# Patient Record
Sex: Male | Born: 1940 | Race: White | Hispanic: No | Marital: Married | Smoking: Never smoker
Health system: Southern US, Community
[De-identification: ages and names within clinical notes are randomized; demographics above are authoritative.]

## PROBLEM LIST (undated history)

## (undated) DIAGNOSIS — K562 Volvulus: Secondary | ICD-10-CM

## (undated) DIAGNOSIS — R130 Aphagia: Secondary | ICD-10-CM

## (undated) DIAGNOSIS — I10 Essential (primary) hypertension: Secondary | ICD-10-CM

## (undated) DIAGNOSIS — I639 Cerebral infarction, unspecified: Secondary | ICD-10-CM

---

## 2000-08-11 DIAGNOSIS — I639 Cerebral infarction, unspecified: Secondary | ICD-10-CM

## 2000-08-11 HISTORY — DX: Cerebral infarction, unspecified: I63.9

## 2010-02-20 ENCOUNTER — Ambulatory Visit: Payer: Self-pay | Admitting: Nurse Practitioner

## 2010-12-04 ENCOUNTER — Ambulatory Visit: Payer: Self-pay

## 2013-12-27 ENCOUNTER — Observation Stay: Payer: Self-pay | Admitting: Internal Medicine

## 2013-12-27 LAB — COMPREHENSIVE METABOLIC PANEL
ALBUMIN: 3.7 g/dL (ref 3.4–5.0)
ALK PHOS: 106 U/L
ALT: 15 U/L (ref 12–78)
Anion Gap: 3 — ABNORMAL LOW (ref 7–16)
BILIRUBIN TOTAL: 0.3 mg/dL (ref 0.2–1.0)
BUN: 8 mg/dL (ref 7–18)
Calcium, Total: 8.7 mg/dL (ref 8.5–10.1)
Chloride: 104 mmol/L (ref 98–107)
Co2: 34 mmol/L — ABNORMAL HIGH (ref 21–32)
Creatinine: 0.82 mg/dL (ref 0.60–1.30)
EGFR (African American): >60
EGFR (Non-African Amer.): >60
GLUCOSE: 116 mg/dL — AB (ref 65–99)
Osmolality: 281 (ref 275–301)
Potassium: 3.9 mmol/L (ref 3.5–5.1)
SGOT(AST): 15 U/L (ref 15–37)
Sodium: 141 mmol/L (ref 136–145)
Total Protein: 7.4 g/dL (ref 6.4–8.2)

## 2013-12-27 LAB — CBC
HCT: 43.4 % (ref 40.0–52.0)
HGB: 14 g/dL (ref 13.0–18.0)
MCH: 32.6 pg (ref 26.0–34.0)
MCHC: 32.2 g/dL (ref 32.0–36.0)
MCV: 101 fL — ABNORMAL HIGH (ref 80–100)
PLATELETS: 131 10*3/uL — AB (ref 150–440)
RBC: 4.29 10*6/uL — ABNORMAL LOW (ref 4.40–5.90)
RDW: 14 % (ref 11.5–14.5)
WBC: 7.7 10*3/uL (ref 3.8–10.6)

## 2013-12-28 LAB — BASIC METABOLIC PANEL
Anion Gap: 5 — ABNORMAL LOW (ref 7–16)
BUN: 7 mg/dL (ref 7–18)
Calcium, Total: 8.1 mg/dL — ABNORMAL LOW (ref 8.5–10.1)
Chloride: 107 mmol/L (ref 98–107)
Co2: 29 mmol/L (ref 21–32)
Creatinine: 0.76 mg/dL (ref 0.60–1.30)
EGFR (Non-African Amer.): >60
Glucose: 75 mg/dL (ref 65–99)
Osmolality: 278 (ref 275–301)
POTASSIUM: 3.7 mmol/L (ref 3.5–5.1)
SODIUM: 141 mmol/L (ref 136–145)

## 2013-12-28 LAB — CBC WITH DIFFERENTIAL/PLATELET
Basophil #: 0 10*3/uL (ref 0.0–0.1)
Basophil %: 0.6 %
EOS PCT: 2.7 %
Eosinophil #: 0.2 10*3/uL (ref 0.0–0.7)
HCT: 37.7 % — ABNORMAL LOW (ref 40.0–52.0)
HGB: 12.7 g/dL — AB (ref 13.0–18.0)
LYMPHS ABS: 1.7 10*3/uL (ref 1.0–3.6)
LYMPHS PCT: 25.1 %
MCH: 33.1 pg (ref 26.0–34.0)
MCHC: 33.8 g/dL (ref 32.0–36.0)
MCV: 98 fL (ref 80–100)
MONOS PCT: 7.9 %
Monocyte #: 0.5 x10 3/mm (ref 0.2–1.0)
NEUTROS PCT: 63.7 %
Neutrophil #: 4.4 10*3/uL (ref 1.4–6.5)
Platelet: 104 10*3/uL — ABNORMAL LOW (ref 150–440)
RBC: 3.85 10*6/uL — AB (ref 4.40–5.90)
RDW: 13.3 % (ref 11.5–14.5)
WBC: 6.9 10*3/uL (ref 3.8–10.6)

## 2014-12-02 NOTE — H&P (Signed)
PATIENT NAME:  Bill [name], Bill [name] MR#:  [id number] DATE OF BIRTH:  Jan 11, 1941  DATE OF ADMISSION:  12/27/2013  PRIMARY CARE PHYSICIAN:  Dr. Shawn StallHollingsworth  ER PHYSICIAN:  Darien Ramusavid W. Kaminski, MD  CHIEF COMPLAINT: Lower gastrointestinal bleed.   HISTORY OF PRESENT ILLNESS: The patient is a 74 year old male with history of dementia, wheelchair bound, follows up with Dr. Shawn StallHollingsworth, goes to senior care every day. Noted to have rectal bleeding while he was at senior care center today. The patient had 2 episodes of rectal bleed mixed in with stool. The patient's rectal exams were grossly positive for blood. We are admitting him for lower gastrointestinal bleed. The patient is a demented patient, unable to give a history.  History obtained from the wife and Emergency Room physician. The patient has a history of stroke and wheelchair bound for 12 years and goes to senior care center and follows up with Dr. Shawn StallHollingsworth.   PAST MEDICAL HISTORY: Significant for history of dysphasia, dysarthria, CVA before. The patient also has a history of hyperlipidemia, vitamin B12 deficiency, urinary incontinence and constipation.   MEDICATIONS:  1.  Cyanocobalamin 1000 mcg injection every week for 8 weeks, then monthly.  2.  The patient is also on aspirin 81 mg daily.  3.  Ventolin inhaler 2 puffs every 4 to 6 hours as needed for wheezing.  4.  He is on hydrocodone with acetaminophen 5/325 one half tablet p.o. b.i.d. for cough.  5.  The patient also is on vitamin D 2000 units once a day.  6.  The patient also on Claritin 10 mg p.o. daily.   DIET:  Pureed diet as per the wife. .    ALLERGIES: No known allergies.   SOCIAL HISTORY: Lives with the wife and wheelchair bound. No history of smoking. No drinking.   PAST SURGICAL HISTORY: None.   REVIEW OF SYSTEMS: Not obtainable because of the dementia.   PHYSICAL EXAMINATION: VITAL SIGNS: Temperature is 97.8, heart rate 77, blood pressure 136/76, sats 100% on room  air.  HEAD: Atraumatic, normocephalic.  EYES: Pupils equal, reacting. Extraocular muscles intact.   ENT: No tympanic membrane congestion. No turbinate hypertrophy. No oropharyngeal erythema.  NECK: Supple. No JVD or carotid bruit.  RESPIRATORY: Clear to auscultation. No wheeze. No rales.  CARDIOVASCULAR: S1, S2 regular. No murmurs. No gallops. PMI is not displaced. Peripheral pulses are equal in bilateral femoral and dorsalis pedis.  GASTROINTESTINAL: Abdomen is nontender, nondistended. Bowel sounds present. The patient has no organomegaly.  RECTAL EXAMINATION: Revealed gross blood.  SKIN: Warm and dry.  EXTREMITIES: No extremity edema. No cyanosis, no clubbing.  NEUROLOGIC: The patient has aphasia, unable to communicate. No facial droop is observed.   PSYCHIATRIC:  Appears to be oriented, able to recognize family members.   LABORATORY, DIAGNOSTIC AND RADIOLOGICAL DATA:   1.  WBC 7.7, hemoglobin 14, hematocrit 43.4, platelets 131.  2.  Sodium 141, potassium 3.9, chloride 104, bicarbonate 34, BUN 8, creatinine 0.8, glucose 116.  3.  LFTs within normal limits.  4.  EKG is not done in the Emergency Room.   ASSESSMENT AND PLAN: The patient is a 74 year old male with rectal bleed, likely secondary to hemorrhoids. The patient has no previous records here and hemoglobin is stable,  hemodynamically stable so will watch him overnight, check CBC q.12 hours and avoid aspirin. If hemoglobin does not drop, the patient probably can be discharged home. If the patient persistently has bleeding, the patient may need a bleeding scan and GI consult  that at that time. At this time, the patient will be n.p.o. until he does not have any more bleeding and continue IV fluids.   CODE STATUS: Full code.  He has filled out the  MOST form.  It says attempt  CPR.    TIME SPENT: About 55 minutes.    ____________________________ Katha Hamming, MD sk:cs D: 12/27/2013 19:35:16 ET T: 12/27/2013 19:55:49  ET JOB#: [id number]  cc: Katha Hamming, MD, <Dictator> Katha Hamming MD ELECTRONICALLY SIGNED 01/05/2014 12:40

## 2014-12-02 NOTE — Discharge Summary (Signed)
PATIENT NAME:  Bill [name], Bill [name] MR#:  [id number] DATE OF BIRTH:  1941-04-02  DATE OF ADMISSION:  12/27/2013 DATE OF DISCHARGE:  12/28/2013  PRESENTING COMPLAINT: Rectal bleed.   DISCHARGE DIAGNOSES: 1.  Mild rectal bleed, resolved, suspect hemorrhoidal.  2.  History of cerebrovascular accident.  CODE STATUS: Full code.   MEDICATIONS: 1.  Cyanocobalamin 1000 mcg injectable every 2 months.  2.  Tylenol 5 mL every 4 hours, 160 mg/mL. 3.  Aspirin 81 mg daily.  4.  Ventolin HFA 2 puffs 4 times a day.  5.  Percocet 5/325, 1 tablet every 6 hours as needed.  6.  Anusol-HC 2.5% topical cream 3 times a day.  7.  Claritin 10 mg daily.  8.  Vitamin D 3000 international units p.o. daily.   FOLLOWUP:   1.  With Dr. Lavetta NielsenJane Hollingsworth as needed.  2.  With outpatient GI if rectal bleeding continues. If large amount of rectal bleed, return to the ER.   DIET: Pureed diet.   CONSULTATIONS: None.   LABORATORY DATA: Hemoglobin is 12.7, hematocrit 37.7, white count 6.9, platelet count 106. Basic metabolic panel within normal limits.   BRIEF SUMMARY OF HOSPITAL COURSE: [name] is a 74 year old Caucasian gentleman with history of dementia, wheelchair-bound, history of CVA, comes in from [facility] after he was noted to have rectal bleed while he was at the Center.  1.  He had 2 episodes of rectal bleed, which was mild with stool. His rectal exam was positive for blood in the ER. He was admitted overnight, given some IV fluids. No further rectal bleeding occurred. The patient does have some history of constipation on and off. He was tolerating diet. He hemodynamically remained stable. His rectal bleed could be suspicious to be due to mild diverticular versus hemorrhoidal. Wife was explained to use Anusol cream as needed. She was also advised to follow up with outpatient GI if he continues to have intermittent rectal bleed.  2.  History of CVA.  3.  History of advanced dementia.   Hospital stay  otherwise remained stable. The patient remained a full code.   TIME SPENT: 40 minutes.   ____________________________ Wylie HailSona A. Allena KatzPatel, MD sap:jcm D: 12/30/2013 16:37:00 ET T: 12/30/2013 23:12:11 ET JOB#: [id number]  cc: Emmalyn Hinson A. Allena KatzPatel, MD, <Dictator> Kristopher OppenheimJane D. Shawn StallHollingsworth, MD [name] MD ELECTRONICALLY SIGNED 01/01/2014 16:24

## 2015-10-08 ENCOUNTER — Ambulatory Visit: Payer: Self-pay

## 2015-10-08 ENCOUNTER — Ambulatory Visit
Admission: RE | Admit: 2015-10-08 | Discharge: 2015-10-08 | Disposition: A | Discharge status: Home / Self Care | Payer: Medicare (Managed Care) | Source: Ambulatory Visit | Attending: Family | Admitting: Family

## 2015-10-08 ENCOUNTER — Other Ambulatory Visit: Payer: Self-pay | Admitting: Family

## 2015-10-08 DIAGNOSIS — R10811 Right upper quadrant abdominal tenderness: Secondary | ICD-10-CM

## 2015-10-08 DIAGNOSIS — R195 Other fecal abnormalities: Secondary | ICD-10-CM

## 2015-10-08 DIAGNOSIS — K8020 Calculus of gallbladder without cholecystitis without obstruction: Secondary | ICD-10-CM | POA: Diagnosis not present

## 2015-10-09 ENCOUNTER — Ambulatory Visit: Payer: Self-pay

## 2016-04-13 ENCOUNTER — Inpatient Hospital Stay (HOSPITAL_COMMUNITY)
Admission: EM | Admit: 2016-04-13 | Discharge: 2016-04-23 | DRG: 388 | Disposition: A | Discharge status: Home Health | Payer: Medicare (Managed Care) | Attending: Internal Medicine | Admitting: Internal Medicine

## 2016-04-13 ENCOUNTER — Encounter (HOSPITAL_COMMUNITY): Payer: Self-pay

## 2016-04-13 ENCOUNTER — Emergency Department (HOSPITAL_COMMUNITY): Payer: Medicare (Managed Care)

## 2016-04-13 DIAGNOSIS — N390 Urinary tract infection, site not specified: Secondary | ICD-10-CM | POA: Diagnosis present

## 2016-04-13 DIAGNOSIS — R1310 Dysphagia, unspecified: Secondary | ICD-10-CM | POA: Diagnosis present

## 2016-04-13 DIAGNOSIS — G8250 Quadriplegia, unspecified: Secondary | ICD-10-CM | POA: Diagnosis present

## 2016-04-13 DIAGNOSIS — Z7982 Long term (current) use of aspirin: Secondary | ICD-10-CM | POA: Diagnosis not present

## 2016-04-13 DIAGNOSIS — E538 Deficiency of other specified B group vitamins: Secondary | ICD-10-CM | POA: Diagnosis present

## 2016-04-13 DIAGNOSIS — I69320 Aphasia following cerebral infarction: Secondary | ICD-10-CM

## 2016-04-13 DIAGNOSIS — E785 Hyperlipidemia, unspecified: Secondary | ICD-10-CM | POA: Diagnosis present

## 2016-04-13 DIAGNOSIS — Z1624 Resistance to multiple antibiotics: Secondary | ICD-10-CM | POA: Diagnosis present

## 2016-04-13 DIAGNOSIS — Z993 Dependence on wheelchair: Secondary | ICD-10-CM

## 2016-04-13 DIAGNOSIS — K562 Volvulus: Secondary | ICD-10-CM | POA: Diagnosis present

## 2016-04-13 DIAGNOSIS — Z8673 Personal history of transient ischemic attack (TIA), and cerebral infarction without residual deficits: Secondary | ICD-10-CM | POA: Diagnosis not present

## 2016-04-13 DIAGNOSIS — E876 Hypokalemia: Secondary | ICD-10-CM | POA: Diagnosis present

## 2016-04-13 DIAGNOSIS — R109 Unspecified abdominal pain: Secondary | ICD-10-CM | POA: Diagnosis present

## 2016-04-13 DIAGNOSIS — R911 Solitary pulmonary nodule: Secondary | ICD-10-CM | POA: Diagnosis present

## 2016-04-13 HISTORY — DX: Cerebral infarction, unspecified: I63.9

## 2016-04-13 LAB — URINALYSIS, ROUTINE W REFLEX MICROSCOPIC
Glucose, UA: NEGATIVE mg/dL
Hgb urine dipstick: NEGATIVE
Ketones, ur: 15 mg/dL — AB
NITRITE: POSITIVE — AB
Protein, ur: 30 mg/dL — AB
SPECIFIC GRAVITY, URINE: 1.031 — AB (ref 1.005–1.030)
pH: 5 (ref 5.0–8.0)

## 2016-04-13 LAB — COMPREHENSIVE METABOLIC PANEL
ALK PHOS: 77 U/L (ref 38–126)
ALT: 17 U/L (ref 17–63)
ANION GAP: 11 (ref 5–15)
AST: 22 U/L (ref 15–41)
Albumin: 4.2 g/dL (ref 3.5–5.0)
BILIRUBIN TOTAL: 1.2 mg/dL (ref 0.3–1.2)
BUN: 9 mg/dL (ref 6–20)
CALCIUM: 9 mg/dL (ref 8.9–10.3)
CO2: 25 mmol/L (ref 22–32)
Chloride: 105 mmol/L (ref 101–111)
Creatinine, Ser: 0.71 mg/dL (ref 0.61–1.24)
GFR calc non Af Amer: >60 mL/min (ref >60)
Glucose, Bld: 108 mg/dL — ABNORMAL HIGH (ref 65–99)
Potassium: 3 mmol/L — ABNORMAL LOW (ref 3.5–5.1)
Sodium: 141 mmol/L (ref 135–145)
TOTAL PROTEIN: 7.1 g/dL (ref 6.5–8.1)

## 2016-04-13 LAB — CBC
HCT: 47 % (ref 39.0–52.0)
HEMOGLOBIN: 15.4 g/dL (ref 13.0–17.0)
MCH: 31.2 pg (ref 26.0–34.0)
MCHC: 32.8 g/dL (ref 30.0–36.0)
MCV: 95.1 fL (ref 78.0–100.0)
Platelets: 121 10*3/uL — ABNORMAL LOW (ref 150–400)
RBC: 4.94 MIL/uL (ref 4.22–5.81)
RDW: 13.5 % (ref 11.5–15.5)
WBC: 11.8 10*3/uL — AB (ref 4.0–10.5)

## 2016-04-13 LAB — URINE MICROSCOPIC-ADD ON
RBC / HPF: NONE SEEN RBC/hpf (ref 0–5)
WBC UA: NONE SEEN WBC/hpf (ref 0–5)

## 2016-04-13 LAB — I-STAT TROPONIN, ED: TROPONIN I, POC: 0 ng/mL (ref 0.00–0.08)

## 2016-04-13 LAB — LIPASE, BLOOD: Lipase: 18 U/L (ref 11–51)

## 2016-04-13 MED ORDER — MORPHINE SULFATE (PF) 4 MG/ML IV SOLN
4.0000 mg | Freq: Once | INTRAVENOUS | Status: AC
  Administered 2016-04-13: 4 mg via INTRAVENOUS
  Filled 2016-04-13: qty 1

## 2016-04-13 MED ORDER — SODIUM CHLORIDE 0.9 % IV SOLN
INTRAVENOUS | Status: DC
  Administered 2016-04-13: 19:00:00 via INTRAVENOUS

## 2016-04-13 MED ORDER — FENTANYL CITRATE (PF) 100 MCG/2ML IJ SOLN
INTRAMUSCULAR | Status: AC
  Filled 2016-04-13: qty 2

## 2016-04-13 MED ORDER — IOPAMIDOL (ISOVUE-300) INJECTION 61%
INTRAVENOUS | Status: AC
  Administered 2016-04-13: 100 mL
  Filled 2016-04-13: qty 100

## 2016-04-13 MED ORDER — MIDAZOLAM HCL 5 MG/ML IJ SOLN
INTRAMUSCULAR | Status: AC
Start: 2016-04-13 — End: 2016-04-13
  Filled 2016-04-13: qty 2

## 2016-04-13 MED ORDER — POTASSIUM CHLORIDE 10 MEQ/100ML IV SOLN
10.0000 meq | Freq: Once | INTRAVENOUS | Status: AC
  Administered 2016-04-13: 10 meq via INTRAVENOUS
  Filled 2016-04-13: qty 100

## 2016-04-13 NOTE — ED Provider Notes (Addendum)
[facility]-EMERGENCY DEPT Provider Note   CSN: [id number] Arrival date & time: 04/13/16  1619     History   Chief Complaint Chief Complaint  Patient presents with  . Abdominal Pain    HPI Bill [name] is a 75 y.o. male.   Abdominal Pain    Pt has history of stroke.  Non verbal.  Wife provides the history.  Pt started having pain in the abdomen on Friday.  The pain has been sharp, every 5 minutes.  Nothing seems to trigger it but appetite is decreased.  No vomiting.  No diarrhea.   No fevers.  No cough.  No history of prior stomach problems.  Past Medical History:  Diagnosis Date  . Stroke [facility])     There are no active problems to display for this patient.   History reviewed. No pertinent surgical history.     Home Medications    Prior to Admission medications   Not on File    Family History No family history on file.  Social History Social History  Substance Use Topics  . Smoking status: Never Smoker  . Smokeless tobacco: Never Used  . Alcohol use Not on file     Allergies   Review of patient's allergies indicates no known allergies.   Review of Systems Review of Systems  Gastrointestinal: Positive for abdominal pain.  All other systems reviewed and are negative.    Physical Exam Updated Vital Signs BP 144/86   Pulse 99   Temp 98.5 F (36.9 C) (Oral)   Resp 12   SpO2 99%   Physical Exam  Constitutional: He appears well-developed and well-nourished. No distress.  HENT:  Head: Normocephalic and atraumatic.  Right Ear: External ear normal.  Left Ear: External ear normal.  Eyes: Conjunctivae are normal. Right eye exhibits no discharge. Left eye exhibits no discharge. No scleral icterus.  Neck: Neck supple. No tracheal deviation present.  Cardiovascular: Normal rate, regular rhythm and intact distal pulses.   Pulmonary/Chest: Effort normal and breath sounds normal. No stridor. No respiratory distress. He has no wheezes. He has no rales.    Abdominal: Soft. He exhibits no distension and no mass. Bowel sounds are decreased. There is generalized tenderness (mild). There is no rebound and no guarding.  Musculoskeletal: He exhibits no edema or tenderness.  Neurological: He is alert. He has normal strength. No cranial nerve deficit (no facial droop, extraocular movements intact, no slurred speech) or sensory deficit. He exhibits normal muscle tone. He displays no seizure activity. Coordination normal.  Skin: Skin is warm and dry. No rash noted.  Psychiatric: He has a normal mood and affect.  Nursing note and vitals reviewed.    ED Treatments / Results  Labs (all labs ordered are listed, but only abnormal results are displayed) Labs Reviewed  COMPREHENSIVE METABOLIC PANEL - Abnormal; Notable for the following:       Result Value   Potassium 3.0 (*)    Glucose, Bld 108 (*)    All other components within normal limits  CBC - Abnormal; Notable for the following:    WBC 11.8 (*)    Platelets 121 (*)    All other components within normal limits  URINALYSIS, ROUTINE W REFLEX MICROSCOPIC (NOT AT [facility]) - Abnormal; Notable for the following:    Color, Urine ORANGE (*)    Specific Gravity, Urine 1.031 (*)    Bilirubin Urine MODERATE (*)    Ketones, ur 15 (*)    Protein, ur 30 (*)  Nitrite POSITIVE (*)    Leukocytes, UA TRACE (*)    All other components within normal limits  URINE MICROSCOPIC-ADD ON - Abnormal; Notable for the following:    Squamous Epithelial / LPF 0-5 (*)    Bacteria, UA FEW (*)    Casts HYALINE CASTS (*)    All other components within normal limits  LIPASE, BLOOD  I-STAT TROPOININ, ED    EKG  EKG Interpretation  Date/Time:  Sunday April 13 2016 19:39:26 EDT Ventricular Rate:  107 PR Interval:    QRS Duration: 130 QT Interval:  354 QTC Calculation: 473 R Axis:   -26 Text Interpretation:  Sinus tachycardia Biatrial enlargement Right bundle branch block Baseline wander in lead(s) V2 V3 No  previous tracing Confirmed by Clell Trahan  MD-J, Gerad Cornelio ([id number]) on 04/13/2016 7:41:53 PM       Radiology Ct Abdomen Pelvis W Contrast  Result Date: 04/13/2016 CLINICAL DATA:  75 year old male with right side abdominal pain and diarrhea. Initial encounter. EXAM: CT ABDOMEN AND PELVIS WITH CONTRAST TECHNIQUE: Multidetector CT imaging of the abdomen and pelvis was performed using the standard protocol following bolus administration of intravenous contrast. CONTRAST:  ISOVUE-300 IOPAMIDOL (ISOVUE-300) INJECTION 61% COMPARISON:  Abdomen ultrasound 10/08/2015. FINDINGS: Partially visible 6 mm right lung nodule on series 4, image 1. Mild costophrenic angle scarring/ atelectasis. No pericardial or pleural effusion. Degenerative changes in the spine. Osteopenia. No acute osseous abnormality identified. The rectum is distended with fluid. The sigmoid colon is highly redundant and is gas and fluid distended tracking into the right upper quadrant. Following the distal sigmoid colon towards the rectum there is a swirling of the mesentery (see coronal images series 5, images 30 through 46). The left colon is mildly distended with gas, fluid, and some stool but otherwise negative. The transverse colon is redundant and distended with gas and stool. The proximal transverse and right colon contains stool throughout but are somewhat less distended. Negative cecum aside from retained stool. Negative terminal ileum. No dilated small bowel. Decompressed stomach and duodenum. Small volume abdominal free fluid in the right upper quadrant and tracking along the redundant sigmoid colon and in the sigmoid mesentery. no abdominal free air. Liver, gallbladder, spleen, pancreas and adrenal glands are within normal limits. The portal venous system is patent. No portal venous gas. Aortoiliac calcified atherosclerosis noted. Major arterial structures are patent. No lymphadenopathy. IMPRESSION: 1. Positive for Sigmoid Volvulus. This is best  demonstrated on the coronal images (coronal images 30-46). The sigmoid colon is highly redundant and is gas distended tracking up into the right upper quadrant, rather than the left upper quadrant as is more typical for sigmoid volvulus. 2. Small volume of free fluid in the right upper quadrant and in the sigmoid mesenteric. No free air. 3. Highly redundant more proximal colon also distended with gas, fluid, and stool. No dilated small bowel. 4. Partially visible right lung nodule in the middle lobe on image 1. Recommend non-emergent follow-up chest CT (noncontrast should suffice). Electronically Signed   By: Odessa Fleming M.D.   On: 04/13/2016 21:01    Procedures Procedures (including critical care time)  Medications Ordered in ED Medications  0.9 %  sodium chloride infusion ( Intravenous New Bag/Given 04/13/16 1922)  potassium chloride 10 mEq in 100 mL IVPB (not administered)  morphine 4 MG/ML injection 4 mg (4 mg Intravenous Given 04/13/16 1922)  iopamidol (ISOVUE-300) 61 % injection (100 mLs  Contrast Given 04/13/16 2032)     Initial Impression / Assessment  and Plan / ED Course  I have reviewed the triage vital signs and the nursing notes.  Pertinent labs & imaging results that were available during my care of the patient were reviewed by me and considered in my medical decision making (see chart for details).  Clinical Course  Comment By Time  Discussed with Dr Magnus IvanBlackman.  Will consult with GI, medical admission Linwood DibblesJon Jeffre Enriques, MD 09/03 2151   CT scan demonstrates a sigmoid volvulus. Laboratory tests also reviewed showing a mild hypokalemia. I will consult with gastroenterology and speak with the medical service regarding admission. Findings and plan were discussed with the family.  Discussed with GI.  Will see patient in the ED  Final Clinical Impressions(s) / ED Diagnoses   Final diagnoses:  Sigmoid volvulus (HCC)        Linwood DibblesJon Nasya Vincent, MD 04/13/16 2225

## 2016-04-13 NOTE — ED Notes (Signed)
GI MD sts he wanted to do procedure at bedside in ED. Dickey Gavealled Tina, RN on 6E to inform her of pt status. Will call when transporting up

## 2016-04-13 NOTE — ED Triage Notes (Signed)
Patient here with spouse, patient non-verbal due to stroke but unable to understand questions and answers with nods of head. Complains of sharp intermittent abdominal pain since Friday with diarrhea, decreased appetite. No nausea, no distress. Abdomen soft.

## 2016-04-13 NOTE — Consult Note (Signed)
Referring Provider: Dr. Lynelle DoctorKnapp  Primary Care Physician:  Delton PrairieBROOKS, KEATAH B, FNP Primary Gastroenterologist:  Not known   Reason for Consultation:  Sigmoid volvulus  HPI: Bill [name] is a 75 y.o. male presented to the emergency room with abdominal pain of one day duration. Patient with history of stroke. Not able to speak. Patient also with quadriplegia. History obtained from family members and wife. According to wife patient was doing okay until this morning. Had a breakfast without any issue. Subsequently started complaining of lower quadrant abdominal pain. Pain worse with oral intake. Decided to bring him to the ER. CT scan showed sigmoid volvulus. GI is consulted for further management. Again, unable to get any history from patient.  Past Medical History:  Diagnosis Date  . Stroke [facility](HCC) 2002   completely dependent with ADLs/IADLs    History reviewed. No pertinent surgical history.  Prior to Admission medications   Not on File    Scheduled Meds:  Continuous Infusions: . sodium chloride 125 mL/hr at 04/13/16 1922  . potassium chloride 10 mEq (04/13/16 2217)   PRN Meds:.  Allergies as of 04/13/2016  . (No Known Allergies)    History reviewed. No pertinent family history.  Social History   Social History  . Marital status: Married    Spouse name: N/A  . Number of children: N/A  . Years of education: N/A   Occupational History  . retired    Social History Main Topics  . Smoking status: Never Smoker  . Smokeless tobacco: Never Used  . Alcohol use No  . Drug use: No  . Sexual activity: Not on file   Other Topics Concern  . Not on file   Social History Narrative  . No narrative on file    Review of Systems: Not able to obtain  Physical Exam: Vital signs: Vitals:   04/13/16 2015 04/13/16 2115  BP: 137/80 144/86  Pulse: 104 99  Resp: 15 12  Temp:       General:   Alert,  Aphasic. Responds to verbal, and. Lungs:  Clear throughout to auscultation.   No  wheezes, crackles, or rhonchi. No acute distress. Heart:  Regular rate and rhythm; no murmurs, clicks, rubs,  or gallops. Abdomen: Tympanic abdomen. Nontender. Hyperactive bowel sounds. LE: no edema   GI:  Lab Results:  Recent Labs  04/13/16 1725  WBC 11.8*  HGB 15.4  HCT 47.0  PLT 121*   BMET  Recent Labs  04/13/16 1725  NA 141  K 3.0*  CL 105  CO2 25  GLUCOSE 108*  BUN 9  CREATININE 0.71  CALCIUM 9.0   LFT  Recent Labs  04/13/16 1725  PROT 7.1  ALBUMIN 4.2  AST 22  ALT 17  ALKPHOS 77  BILITOT 1.2   PT/INR No results for input(s): LABPROT, INR in the last 72 hours.   Studies/Results: Ct Abdomen Pelvis W Contrast  Result Date: 04/13/2016 CLINICAL DATA:  75 year old male with right side abdominal pain and diarrhea. Initial encounter. EXAM: CT ABDOMEN AND PELVIS WITH CONTRAST TECHNIQUE: Multidetector CT imaging of the abdomen and pelvis was performed using the standard protocol following bolus administration of intravenous contrast. CONTRAST:  100mL ISOVUE-300 IOPAMIDOL (ISOVUE-300) INJECTION 61% COMPARISON:  Abdomen ultrasound 10/08/2015. FINDINGS: Partially visible 6 mm right lung nodule on series 4, image 1. Mild costophrenic angle scarring/ atelectasis. No pericardial or pleural effusion. Degenerative changes in the spine. Osteopenia. No acute osseous abnormality identified. The rectum is distended with fluid. The sigmoid  colon is highly redundant and is gas and fluid distended tracking into the right upper quadrant. Following the distal sigmoid colon towards the rectum there is a swirling of the mesentery (see coronal images series 5, images 30 through 46). The left colon is mildly distended with gas, fluid, and some stool but otherwise negative. The transverse colon is redundant and distended with gas and stool. The proximal transverse and right colon contains stool throughout but are somewhat less distended. Negative cecum aside from retained stool. Negative  terminal ileum. No dilated small bowel. Decompressed stomach and duodenum. Small volume abdominal free fluid in the right upper quadrant and tracking along the redundant sigmoid colon and in the sigmoid mesentery. no abdominal free air. Liver, gallbladder, spleen, pancreas and adrenal glands are within normal limits. The portal venous system is patent. No portal venous gas. Aortoiliac calcified atherosclerosis noted. Major arterial structures are patent. No lymphadenopathy. IMPRESSION: 1. Positive for Sigmoid Volvulus. This is best demonstrated on the coronal images (coronal images 30-46). The sigmoid colon is highly redundant and is gas distended tracking up into the right upper quadrant, rather than the left upper quadrant as is more typical for sigmoid volvulus. 2. Small volume of free fluid in the right upper quadrant and in the sigmoid mesenteric. No free air. 3. Highly redundant more proximal colon also distended with gas, fluid, and stool. No dilated small bowel. 4. Partially visible right lung nodule in the middle lobe on image 1. Recommend non-emergent follow-up chest CT (noncontrast should suffice). Electronically Signed   By: Odessa Fleming M.D.   On: 04/13/2016 21:01    Impression/Plan: Sigmoid volvulus.  History of Stroke with residual aphasia and quadriplegia. Colonoscopy 2 years ago at [facility]. Normal per wife.  Recommendations --------------------------- - Flexible sigmoidoscopy for decompression. Risk, benefits and alternatives discussed with the patient's wife and daughter. High risk for perforation and bleeding discussed as well. Verbalized understanding. - Recommend surgical consultation as well as there is high chance of recurrence after flexible sigmoidoscopy decompression     LOS: 0 days   Anneka Studer  04/13/2016, 10:50 PM  Pager [phone] If no answer or after 5 PM call [phone]

## 2016-04-13 NOTE — H&P (Signed)
History and Physical    KARAM DUNSON ZOX:[id number] DOB: [birth year] DOA: 04/13/2016  PCP: Delton Prairie, FNP - [facility] [place] Consultants:  None Patient coming from: home - lives with wife  Chief Complaint: abdominal pain  HPI: DASCHEL ROUGHTON is a 75 y.o. 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  His wife provided the history since the patient is unable to communicate.  She reports that he has had lower abdominal pain.  Ate breakfast but wouldn't eat during the rest of the day.  Pain started Friday, worse yesterday.  Small BMs, loose and dark.  No n/v.  Drinks fluids slowly.  They live in [name] and are spending the long weekend visiting their grandchildren here in [place].   ED Course: Per Dr. Lynelle Doctor: CT scan demonstrates a sigmoid volvulus. Laboratory tests also reviewed showing a mild hypokalemia. I will consult with gastroenterology and speak with the medical service regarding admission. Findings and plan were discussed with the family.  Discussed with GI.  Will see patient in the ED  Patient was also discussed with Dr. Magnus Ivan.  Review of Systems: Unable to review  Ambulatory Status:  bedbound for 14-15 years  Past Medical History:  Diagnosis Date  . Stroke [facility]) 2002   completely dependent with ADLs/IADLs    History reviewed. No pertinent surgical history.  Social History   Social History  . Marital status: Married    Spouse name: N/A  . Number of children: N/A  . Years of education: N/A   Occupational History  . retired    Social History Main Topics  . Smoking status: Never Smoker  . Smokeless tobacco: Never Used  . Alcohol use No  . Drug use: No  . Sexual activity: Not on file   Other Topics Concern  . Not on file   Social History Narrative  . No narrative on file    No Known Allergies  History reviewed. No pertinent  family history.  Prior to Admission medications   Not on File    Physical Exam: Vitals:   04/13/16 1845 04/13/16 1930 04/13/16 2015 04/13/16 2115  BP: 135/83 164/88 137/80 144/86  Pulse: 105 107 104 99  Resp: 18 16 15 12   Temp:      TempSrc:      SpO2: 99% 99% 96% 99%     General: Appears calm and comfortable and is NAD; smiles and grunts but otherwise is unable to effectively communicate Eyes:  PERRL, EOMI, normal lids, iris ENT:  grossly normal hearing (per wife), lips & tongue, mmm Neck:  no LAD, masses or thyromegaly Cardiovascular:  Mild tachycardia, no m/r/g. No LE edema.  Respiratory: CTA bilaterally, no w/r/r. Normal respiratory effort. Abdomen:  soft, ntnd, NABS Skin:  no rash or induration seen on limited exam Musculoskeletal:  Atrophic LE bilaterally Psychiatric: smiles, is pleasant, does not effectively communicate otherwise Neurologic: unable to perform  Labs on Admission: I have personally reviewed following labs and imaging studies  CBC:  Recent Labs Lab 04/13/16 1725  WBC 11.8*  HGB 15.4  HCT 47.0  MCV 95.1  PLT 121*   Basic Metabolic Panel:  Recent Labs Lab 04/13/16 1725  NA 141  K 3.0*  CL 105  CO2 25  GLUCOSE 108*  BUN 9  CREATININE 0.71  CALCIUM 9.0   GFR: CrCl cannot be calculated (Unknown ideal weight.). Liver Function Tests:  Recent  Labs Lab 04/13/16 1725  AST 22  ALT 17  ALKPHOS 77  BILITOT 1.2  PROT 7.1  ALBUMIN 4.2    Recent Labs Lab 04/13/16 1725  LIPASE 18   No results for input(s): AMMONIA in the last 168 hours. Coagulation Profile: No results for input(s): INR, PROTIME in the last 168 hours. Cardiac Enzymes: No results for input(s): CKTOTAL, CKMB, CKMBINDEX, TROPONINI in the last 168 hours. BNP (last 3 results) No results for input(s): PROBNP in the last 8760 hours. HbA1C: No results for input(s): HGBA1C in the last 72 hours. CBG: No results for input(s): GLUCAP in the last 168 hours. Lipid  Profile: No results for input(s): CHOL, HDL, LDLCALC, TRIG, CHOLHDL, LDLDIRECT in the last 72 hours. Thyroid Function Tests: No results for input(s): TSH, T4TOTAL, FREET4, T3FREE, THYROIDAB in the last 72 hours. Anemia Panel: No results for input(s): VITAMINB12, FOLATE, FERRITIN, TIBC, IRON, RETICCTPCT in the last 72 hours. Urine analysis:    Component Value Date/Time   COLORURINE ORANGE (A) 04/13/2016 1933   APPEARANCEUR CLEAR 04/13/2016 1933   LABSPEC 1.031 (H) 04/13/2016 1933   PHURINE 5.0 04/13/2016 1933   GLUCOSEU NEGATIVE 04/13/2016 1933   HGBUR NEGATIVE 04/13/2016 1933   BILIRUBINUR MODERATE (A) 04/13/2016 1933   KETONESUR 15 (A) 04/13/2016 1933   PROTEINUR 30 (A) 04/13/2016 1933   NITRITE POSITIVE (A) 04/13/2016 1933   LEUKOCYTESUR TRACE (A) 04/13/2016 1933    Creatinine Clearance: CrCl cannot be calculated (Unknown ideal weight.).  Sepsis Labs: @LABRCNTIP (procalcitonin:4,lacticidven:4) )No results found for this or any previous visit (from the past 240 hour(s)).   Radiological Exams on Admission: Ct Abdomen Pelvis W Contrast  Result Date: 04/13/2016 CLINICAL DATA:  75 year old male with right side abdominal pain and diarrhea. Initial encounter. EXAM: CT ABDOMEN AND PELVIS WITH CONTRAST TECHNIQUE: Multidetector CT imaging of the abdomen and pelvis was performed using the standard protocol following bolus administration of intravenous contrast. CONTRAST:  ISOVUE-300 IOPAMIDOL (ISOVUE-300) INJECTION 61% COMPARISON:  Abdomen ultrasound 10/08/2015. FINDINGS: Partially visible 6 mm right lung nodule on series 4, image 1. Mild costophrenic angle scarring/ atelectasis. No pericardial or pleural effusion. Degenerative changes in the spine. Osteopenia. No acute osseous abnormality identified. The rectum is distended with fluid. The sigmoid colon is highly redundant and is gas and fluid distended tracking into the right upper quadrant. Following the distal sigmoid colon towards the  rectum there is a swirling of the mesentery (see coronal images series 5, images 30 through 46). The left colon is mildly distended with gas, fluid, and some stool but otherwise negative. The transverse colon is redundant and distended with gas and stool. The proximal transverse and right colon contains stool throughout but are somewhat less distended. Negative cecum aside from retained stool. Negative terminal ileum. No dilated small bowel. Decompressed stomach and duodenum. Small volume abdominal free fluid in the right upper quadrant and tracking along the redundant sigmoid colon and in the sigmoid mesentery. no abdominal free air. Liver, gallbladder, spleen, pancreas and adrenal glands are within normal limits. The portal venous system is patent. No portal venous gas. Aortoiliac calcified atherosclerosis noted. Major arterial structures are patent. No lymphadenopathy. IMPRESSION: 1. Positive for Sigmoid Volvulus. This is best demonstrated on the coronal images (coronal images 30-46). The sigmoid colon is highly redundant and is gas distended tracking up into the right upper quadrant, rather than the left upper quadrant as is more typical for sigmoid volvulus. 2. Small volume of free fluid in the right upper quadrant and in  the sigmoid mesenteric. No free air. 3. Highly redundant more proximal colon also distended with gas, fluid, and stool. No dilated small bowel. 4. Partially visible right lung nodule in the middle lobe on image 1. Recommend non-emergent follow-up chest CT (noncontrast should suffice). Electronically Signed   By: Odessa FlemingH  Hall M.D.   On: 04/13/2016 21:01    EKG: Independently reviewed.  Sinus tachycardia with rate 107; RBBB; nonspecific ST changes with no evidence of acute ischemia  Assessment/Plan Principal Problem:   Sigmoid volvulus (HCC) Active Problems:   H/O: CVA (cerebrovascular accident)   Pulmonary nodule   Hypokalemia   Sigmoid volvulus -Patient with subacute onset of  abdominal pain found to have volvulus on CT -GI has been consulted and is planning to do flexible sigmoidoscopy to try to reduce (consult pending) -If unsuccessful and/or has recurrence, may need surgical repair; gen surg has also been consulted by the ER -Leukocytosis likely as a result, will recheck in AM  H/o CVA -Severe debility, completely dependent for ADLs -Only takes ASA daily (will continue) -Was prescribed Lipitor at last hospitalization ([facility] or [facility]), does not appear to be taking it -Unclear benefit from HTN/HLD control at this time and may not be worth side effect risk  -Will monitor on telemetry given his tenuous baseline condition and h/o multiple recurrent CVAs  Pulmonary nodule -Consider outpatient f/u  Hypokalemia -Repleted in ER -Will check BMP in AM  Other -Patient with extremely limited quality of life (although wife does apparently take him on trips to visit family) -Would benefit from palliative care discussion at some point -Family seems fairly resistant to care (threatened to leave AMA from ER despite emergent medical condition in need of imminent repair), so will defer to PCP -Patient is full code   DVT prophylaxis: Lovenox Code Status:  Full - confirmed with patient/family Family Communication: Wife and other family members present throughout evaluation Disposition Plan: To be determined Consults called: GI, general surgery (by ER)   Admission status: Admit to telemetry    Jonah BlueJennifer Margarita Bobrowski MD [facility]  If 7PM-7AM, please contact night-coverage [email or web address] Password [email or web address]  04/13/2016, 11:16 PM

## 2016-04-14 ENCOUNTER — Encounter (HOSPITAL_COMMUNITY): Payer: Self-pay | Admitting: *Deleted

## 2016-04-14 ENCOUNTER — Encounter (HOSPITAL_COMMUNITY)
Admission: EM | Disposition: A | Discharge status: Home Health | Payer: Self-pay | Source: Home / Self Care | Attending: Family Medicine

## 2016-04-14 ENCOUNTER — Inpatient Hospital Stay (HOSPITAL_COMMUNITY): Payer: Medicare (Managed Care)

## 2016-04-14 HISTORY — PX: FLEXIBLE SIGMOIDOSCOPY: SHX5431

## 2016-04-14 LAB — CBC
HCT: 39.8 % (ref 39.0–52.0)
Hemoglobin: 12.7 g/dL — ABNORMAL LOW (ref 13.0–17.0)
MCH: 30.5 pg (ref 26.0–34.0)
MCHC: 31.9 g/dL (ref 30.0–36.0)
MCV: 95.7 fL (ref 78.0–100.0)
PLATELETS: 98 10*3/uL — AB (ref 150–400)
RBC: 4.16 MIL/uL — ABNORMAL LOW (ref 4.22–5.81)
RDW: 13.5 % (ref 11.5–15.5)
WBC: 8.4 10*3/uL (ref 4.0–10.5)

## 2016-04-14 LAB — BASIC METABOLIC PANEL
Anion gap: 7 (ref 5–15)
BUN: 8 mg/dL (ref 6–20)
CO2: 26 mmol/L (ref 22–32)
CREATININE: 0.76 mg/dL (ref 0.61–1.24)
Calcium: 7.9 mg/dL — ABNORMAL LOW (ref 8.9–10.3)
Chloride: 108 mmol/L (ref 101–111)
GFR calc Af Amer: >60 mL/min (ref >60)
GLUCOSE: 92 mg/dL (ref 65–99)
Potassium: 3.4 mmol/L — ABNORMAL LOW (ref 3.5–5.1)
SODIUM: 141 mmol/L (ref 135–145)

## 2016-04-14 SURGERY — SIGMOIDOSCOPY, FLEXIBLE
Anesthesia: Moderate Sedation | Laterality: Left

## 2016-04-14 MED ORDER — ENOXAPARIN SODIUM 40 MG/0.4ML ~~LOC~~ SOLN
40.0000 mg | Freq: Every day | SUBCUTANEOUS | Status: DC
  Administered 2016-04-14 – 2016-04-17 (×4): 40 mg via SUBCUTANEOUS
  Filled 2016-04-14 (×4): qty 0.4

## 2016-04-14 MED ORDER — FENTANYL CITRATE (PF) 100 MCG/2ML IJ SOLN
INTRAMUSCULAR | Status: DC | PRN
  Administered 2016-04-14: 25 ug via INTRAVENOUS

## 2016-04-14 MED ORDER — ACETAMINOPHEN 325 MG PO TABS
650.0000 mg | ORAL_TABLET | Freq: Four times a day (QID) | ORAL | Status: DC | PRN
  Administered 2016-04-16: 650 mg via ORAL
  Filled 2016-04-14: qty 2

## 2016-04-14 MED ORDER — ASPIRIN 81 MG PO CHEW
81.0000 mg | CHEWABLE_TABLET | Freq: Every day | ORAL | Status: DC
  Administered 2016-04-15 – 2016-04-23 (×9): 81 mg via ORAL
  Filled 2016-04-14 (×8): qty 1

## 2016-04-14 MED ORDER — ONDANSETRON HCL 4 MG/2ML IJ SOLN: 4.0000 mg | Freq: Four times a day (QID) | INTRAMUSCULAR | Status: DC | PRN

## 2016-04-14 MED ORDER — ONDANSETRON HCL 4 MG PO TABS: 4.0000 mg | ORAL_TABLET | Freq: Four times a day (QID) | ORAL | Status: DC | PRN

## 2016-04-14 MED ORDER — MIDAZOLAM HCL 2 MG/2ML IJ SOLN
INTRAMUSCULAR | Status: DC | PRN
  Administered 2016-04-14: 2 mg via INTRAVENOUS

## 2016-04-14 MED ORDER — SODIUM CHLORIDE 0.9% FLUSH
3.0000 mL | Freq: Two times a day (BID) | INTRAVENOUS | Status: DC
  Administered 2016-04-14 – 2016-04-22 (×4): 3 mL via INTRAVENOUS

## 2016-04-14 MED ORDER — ACETAMINOPHEN 650 MG RE SUPP: 650.0000 mg | Freq: Four times a day (QID) | RECTAL | Status: DC | PRN

## 2016-04-14 MED ORDER — LACTATED RINGERS IV SOLN
INTRAVENOUS | Status: DC
  Administered 2016-04-14 (×2): via INTRAVENOUS

## 2016-04-14 MED ORDER — MORPHINE SULFATE (PF) 2 MG/ML IV SOLN: 2.0000 mg | INTRAVENOUS | Status: DC | PRN

## 2016-04-14 NOTE — Progress Notes (Signed)
Pt arrived from ED and noticed scant amount of blood coming from urethra. Cleansed area and applied condom cath. Will continue to monitor.

## 2016-04-14 NOTE — Progress Notes (Signed)
[facility] Progress Note  Iva LentoWilliam M Mittelstaedt 75 y.o. 03/15/1941   Subjective: Patient is feeling better. Abdominal pain resolved. Afebrile.  Objective: Vital signs in last 24 hours: Vitals:   04/14/16 0509 04/14/16 1024  BP: (!) 146/71 (!) 152/71  Pulse: 70 67  Resp: 19 18  Temp: 98.2 F (36.8 C) 98 F (36.7 C)    Physical Exam:  General:   Alert,  Aphasic. Responds to verbal, and. Lungs:  Clear throughout to auscultation.   No wheezes, crackles, or rhonchi. No acute distress. Heart:  Regular rate and rhythm; no murmurs, clicks, rubs,  or gallops. Abdomen: Mild distention noted. Nontender. Bowel sounds present LE: no edema                   Lab Results:  Recent Labs  04/13/16 1725 04/14/16 0232  NA 141 141  K 3.0* 3.4*  CL 105 108  CO2 25 26  GLUCOSE 108* 92  BUN 9 8  CREATININE 0.71 0.76  CALCIUM 9.0 7.9*    Recent Labs  04/13/16 1725  AST 22  ALT 17  ALKPHOS 77  BILITOT 1.2  PROT 7.1  ALBUMIN 4.2    Recent Labs  04/13/16 1725 04/14/16 0232  WBC 11.8* 8.4  HGB 15.4 12.7*  HCT 47.0 39.8  MCV 95.1 95.7  PLT 121* 98*   No results for input(s): LABPROT, INR in the last 72 hours.    Assessment/Plan: Acute sigmoid volvulus status post endoscopic  decompression  History of Stroke with residual aphasia and quadriplegia. [facility] Normal per wife.  Recommendation ---------------------- - Follow surgical recommendation. Notes are reviewed.  - Okay to have clear liquid diet from GI standpoint, if it is okay with surgery - GI will sign off. Call us back if needed.  Danni Shima 04/14/2016, 12:05 PM  Pager [phone]  If no answer or after 5 PM call [phone]

## 2016-04-14 NOTE — Progress Notes (Signed)
Bill LentoWilliam M [name] ONG:[id number] DOB: 08/16/1940 DOA: 04/13/2016 PCP: Delton PrairieBROOKS, KEATAH B, FNP  Brief narrative:  75 year old male [facility] [place]-at baseline is wheelchair bound and aphasic and is on a pured diet Prior multiple CVAs complicated by dysphagia and admissions for aspiration/2014 Hyperlipidemia B12 deficiency Urinary incontinence History of lower GI bleed-diverticular bleed vs hemorrhoids 12/2013  Past medical history-As per Problem list Chart reviewed as below-   Consultants:  Gastroenterology  General surgery    Procedures:  Sigmoid decompression  Antibiotics:  None currently   Subjective  Fair No n/v/cp No diarr Cannot communicate needs nor ROS Still npo    Objective    Interim History:   Telemetry:    Objective: Vitals:   04/14/16 0025 04/14/16 0143 04/14/16 0509 04/14/16 1024  BP: 120/64 (!) 156/83 (!) 146/71 (!) 152/71  Pulse: 95 85 70 67  Resp: 14 17 19 18   Temp:  99.3 F (37.4 C) 98.2 F (36.8 C) 98 F (36.7 C)  TempSrc:  Axillary Oral Oral  SpO2: 99% 96% 100% 100%  Weight:  58.4 kg (128 lb 12 oz)      Intake/Output Summary (Last 24 hours) at 04/14/16 0755 Last data filed at 04/14/16 0600  Gross per 24 hour  Intake           191.67 ml  Output                0 ml  Net           191.67 ml    Exam:  General: eomi ncat, aphasic Cardiovascular:  s1 s2 no m/r/g Respiratory: clear no added sound Abdomen:  Soft nt nd-slight distension, no rebound, no gaurd Skin no le edema Neuro intact but aphasic-cannot foll commands  Data Reviewed: Basic Metabolic Panel:  Recent Labs Lab 04/13/16 1725 04/14/16 0232  NA 141 141  K 3.0* 3.4*  CL 105 108  CO2 25 26  GLUCOSE 108* 92  BUN 9 8  CREATININE 0.71 0.76  CALCIUM 9.0 7.9*   Liver Function Tests:  Recent Labs Lab 04/13/16 1725  AST 22  ALT 17  ALKPHOS 77  BILITOT 1.2  PROT 7.1  ALBUMIN 4.2    Recent Labs Lab 04/13/16 1725    LIPASE 18   No results for input(s): AMMONIA in the last 168 hours. CBC:  Recent Labs Lab 04/13/16 1725 04/14/16 0232  WBC 11.8* 8.4  HGB 15.4 12.7*  HCT 47.0 39.8  MCV 95.1 95.7  PLT 121* 98*   Cardiac Enzymes: No results for input(s): CKTOTAL, CKMB, CKMBINDEX, TROPONINI in the last 168 hours. BNP: Invalid input(s): POCBNP CBG: No results for input(s): GLUCAP in the last 168 hours.  No results found for this or any previous visit (from the past 240 hour(s)).   Studies:              All Imaging reviewed and is as per above notation   Scheduled Meds: . aspirin  81 mg Oral Daily  . enoxaparin (LOVENOX) injection  40 mg Subcutaneous Daily  . sodium chloride flush  3 mL Intravenous Q12H   Continuous Infusions: . lactated ringers 100 mL/hr at 04/14/16 0405     Assessment/Plan:  Sigmoid volvulus -Patient with subacute onset of abdominal pain found to have volvulus on CT -GI did flexible sigmoidoscopy toreduce  -?may need surgical repair; gen surg input appreciated and await final discussions -Leukocytosis lresolved  H/o CVA -Severe debility, completely dependent for ADLs -Only takes  ASA daily (will continue) -Was prescribed Lipitor at last hospitalization ([facility] or [facility]), does not appear to be taking it -Unclear benefit from HTN/HLD control, wouldn't probably chenge long-term outcomes  Pulmonary nodule -Consider outpatient f/u  Hypokalemia -Repleted in ER -recheck in am  Inpatient pending final discussions re: need for colectomy this admit   Pleas Koch, MD  [facility] Pager [phone] 04/14/2016, 7:55 AM    LOS: 1 day

## 2016-04-14 NOTE — Progress Notes (Signed)
Spoke with [facility] per family request of patient.  On call MD informed this RN that rounding surgeon will see patient tomorrow, 04/15/2016.  Diet can be advanced to clear liquids for the remaining of today and NPO after midnight.

## 2016-04-14 NOTE — Op Note (Signed)
[facility] Patient Name: [name] Procedure Date : 04/14/2016 MRN: [id number] Attending MD: Kathi Der , MD Date of Birth: March 17, 1941 CSN: [id number] Age: 75 Admit Type: Outpatient Procedure:                Flexible Sigmoidoscopy Indications:              Abnormal CT of the GI tract Providers:                Kathi Der, MD, Sarah Monday RN, RN, Lorenda Ishihara, Technician Referring MD:              Medicines:                Fentanyl 25 micrograms IV, Midazolam 2 mg IV Complications:            No immediate complications. Estimated Blood Loss:     Estimated blood loss: none. Procedure:                Pre-Anesthesia Assessment:                           - Prior to the procedure, a History and Physical                            was performed, and patient medications and                            allergies were reviewed. The patient's tolerance of                            previous anesthesia was also reviewed. The risks                            and benefits of the procedure and the sedation                            options and risks were discussed with the patient.                            All questions were answered, and informed consent                            was obtained. Prior Anticoagulants: The patient has                            taken no previous anticoagulant or antiplatelet                            agents. ASA Grade Assessment: III - A patient with                            severe systemic disease. After reviewing the risks  and benefits, the patient was deemed in                            satisfactory condition to undergo the procedure.                           After obtaining informed consent, the scope was                            passed under direct vision. The [id number] ([id number])                            scope was introduced through the anus and advanced                             to the the left transverse colon. The flexible                            sigmoidoscopy was accomplished with ease. The                            patient tolerated the procedure well. The quality                            of the bowel preparation was poor. Scope In: Scope Out: Findings:      stool felt on rectal exam. limited evaluation secondary to pain.      A volvulus, with viable appearing mucosa, was found in the distal       sigmoid colon. Decompression of the volvulus was attempted and was       successful, with complete decompression achieved. Estimated blood loss:       none.      scope was advanced up to the transverse colon. Certain part of colonic       mucosa was not well visualized because of retained stool.      rectum was also not well visualized because of retained stool. Impression:               - Preparation of the colon was poor.                           - Volvulus. Successful complete decompression                            achieved.                           - No specimens collected. Recommendation:           - Admit the patient to hospital ward for ongoing                            care.                           - NPO. Procedure Code(s):        --- Professional ---  [phone], Sigmoidoscopy, flexible; with decompression                            (for pathologic distention) (eg, volvulus,                            megacolon), including placement of decompression                            tube, when performed Diagnosis Code(s):        --- Professional ---                           K56.2, Volvulus                           R93.3, Abnormal findings on diagnostic imaging of                            other parts of digestive tract CPT copyright 2016 [employer]. All rights reserved. The codes documented in this report are preliminary and upon coder review may  be revised to meet current compliance  requirements. Kathi DerParag Lela Murfin, MD Kathi DerParag Meigan [place]ates, MD 04/14/2016 12:38:00 AM Number of Addenda: 0

## 2016-04-14 NOTE — Care Management (Signed)
04/14/2016 [facility], CM following for progression and d/c planning at this time.

## 2016-04-14 NOTE — Progress Notes (Signed)
New Admission Note: 04/14/2016  Arrival Method: stretcher Mental Orientation:alert and oriented to person only Telemetry: 6E17, 2nd verification completed Assessment: completed Skin: 2 RNs checked. See skin assessment.  IV: RAC with fluids infusing Safety Measures: Bed in low position, bed alarm on.   Admission: Completed [address] Orientation: Patient has been orientated to the room, unit and staff.  Family:Wife at bedside.  Orders have been reviewed and implemented. Will continue to monitor the patient.

## 2016-04-14 NOTE — Consult Note (Signed)
Reason for Consult: Sigmoid volvulus Referring Physician: Dr. Karmen [name]  Bill [name] is an 75 y.o. male.  HPI: We have been asked to see this 75 year old gentleman who presented late yesterday with a sigmoid volvulus. This was found on a CT of the abdomen and pelvis. GI has already evaluated the patient and has performed a flexible sigmoidoscopy reducing the volvulus. No abnormalities or ischemia was seen. 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  He and his wife were visiting family and lives currently in [place]. This morning he denies abdominal pain.  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 He had been doing well until couple days ago when the abdominal pain started along with loose bowel movements. Past Medical History:  Diagnosis Date  . Stroke [facility]) 2002   completely dependent with ADLs/IADLs    History reviewed. No pertinent surgical history.  History reviewed. No pertinent family history.  Social History:  reports that he has never smoked. He has never used smokeless tobacco. He reports that he does not drink alcohol or use drugs.  Allergies: No Known Allergies  Medications: I have reviewed the patient's current medications.  Results for orders placed or performed during the hospital encounter of 04/13/16 (from the past 48 hour(s))  Lipase, blood     Status: None   Collection Time: 04/13/16  5:25 PM  Result Value Ref Range   Lipase 18 11 - 51 U/L  Comprehensive metabolic panel     Status: Abnormal   Collection Time: 04/13/16  5:25 PM  Result Value Ref Range   Sodium 141 135 - 145 mmol/L   Potassium 3.0 (L) 3.5 - 5.1 mmol/L   Chloride 105 101 - 111 mmol/L   CO2 25 22 - 32 mmol/L   Glucose, Bld 108 (H) 65 - 99 mg/dL   BUN 9 6 - 20 mg/dL   Creatinine, Ser 0.71 0.61 - 1.24 mg/dL   Calcium 9.0 8.9 - 10.3 mg/dL   Total Protein 7.1 6.5 - 8.1 g/dL   Albumin 4.2 3.5 - 5.0 g/dL   AST 22 15 - 41 U/L   ALT 17 17 - 63 U/L   Alkaline Phosphatase 77 38 - 126 U/L   Total Bilirubin 1.2 0.3 - 1.2 mg/dL   GFR calc non Af Amer >60 >60 mL/min   GFR calc Af Amer >60 >60 mL/min    Comment: (NOTE) The eGFR has been calculated using the CKD EPI equation. This calculation has not been validated in all clinical situations. eGFR's persistently <60 mL/min signify possible Chronic Kidney Disease.    Anion gap 11 5 - 15  CBC     Status: Abnormal   Collection Time: 04/13/16  5:25 PM  Result Value Ref Range   WBC 11.8 (H) 4.0 - 10.5 K/uL   RBC 4.94 4.22 - 5.81 MIL/uL   Hemoglobin 15.4 13.0 - 17.0 g/dL   HCT 47.0 39.0 - 52.0 %   MCV 95.1 78.0 - 100.0 fL   MCH 31.2 26.0 - 34.0 pg   MCHC 32.8 30.0 - 36.0 g/dL   RDW 13.5 11.5 - 15.5 %   Platelets 121 (L) 150 - 400 K/uL  Urinalysis, Routine w reflex microscopic     Status: Abnormal   Collection Time: 04/13/16  7:33 PM  Result Value Ref Range   Color, Urine ORANGE (A) YELLOW    Comment: BIOCHEMICALS MAY BE AFFECTED BY COLOR   APPearance CLEAR CLEAR   Specific Gravity, Urine 1.031 (H) 1.005 -  1.030   pH 5.0 5.0 - 8.0   Glucose, UA NEGATIVE NEGATIVE mg/dL   Hgb urine dipstick NEGATIVE NEGATIVE   Bilirubin Urine MODERATE (A) NEGATIVE   Ketones, ur 15 (A) NEGATIVE mg/dL   Protein, ur 30 (A) NEGATIVE mg/dL   Nitrite POSITIVE (A) NEGATIVE   Leukocytes, UA TRACE (A) NEGATIVE  I-stat troponin, ED     Status: None   Collection Time: 04/13/16  7:33 PM  Result Value Ref Range   Troponin i, poc 0.00 0.00 - 0.08 ng/mL   Comment 3            Comment: Due to the release kinetics of cTnI, a negative result within the first hours of the onset of symptoms does not rule out myocardial infarction with certainty. If myocardial infarction is still suspected, repeat the test at appropriate intervals.   Urine microscopic-add on     Status: Abnormal   Collection Time: 04/13/16  7:33 PM  Result Value Ref Range   Squamous Epithelial / LPF 0-5 (A) NONE SEEN   WBC, UA NONE SEEN 0 - 5 WBC/hpf   RBC / HPF NONE SEEN 0 - 5  RBC/hpf   Bacteria, UA FEW (A) NONE SEEN   Casts HYALINE CASTS (A) NEGATIVE   Urine-Other MUCOUS PRESENT   Basic metabolic panel     Status: Abnormal   Collection Time: 04/14/16  2:32 AM  Result Value Ref Range   Sodium 141 135 - 145 mmol/L   Potassium 3.4 (L) 3.5 - 5.1 mmol/L   Chloride 108 101 - 111 mmol/L   CO2 26 22 - 32 mmol/L   Glucose, Bld 92 65 - 99 mg/dL   BUN 8 6 - 20 mg/dL   Creatinine, Ser 0.76 0.61 - 1.24 mg/dL   Calcium 7.9 (L) 8.9 - 10.3 mg/dL   GFR calc non Af Amer >60 >60 mL/min   GFR calc Af Amer >60 >60 mL/min    Comment: (NOTE) The eGFR has been calculated using the CKD EPI equation. This calculation has not been validated in all clinical situations. eGFR's persistently <60 mL/min signify possible Chronic Kidney Disease.    Anion gap 7 5 - 15  CBC     Status: Abnormal   Collection Time: 04/14/16  2:32 AM  Result Value Ref Range   WBC 8.4 4.0 - 10.5 K/uL   RBC 4.16 (L) 4.22 - 5.81 MIL/uL   Hemoglobin 12.7 (L) 13.0 - 17.0 g/dL   HCT 39.8 39.0 - 52.0 %   MCV 95.7 78.0 - 100.0 fL   MCH 30.5 26.0 - 34.0 pg   MCHC 31.9 30.0 - 36.0 g/dL   RDW 13.5 11.5 - 15.5 %   Platelets 98 (L) 150 - 400 K/uL    Comment: PLATELET COUNT CONFIRMED BY SMEAR    Dg Abdomen 1 View  Result Date: 04/14/2016 CLINICAL DATA:  Status post colonoscopy.  Initial encounter. EXAM: ABDOMEN - 1 VIEW COMPARISON:  CT of the abdomen and pelvis performed 04/13/2016 FINDINGS: The colon is filled with air and a small to moderate amount of stool. No free intra-abdominal air is seen, though evaluation for free air is limited on a single supine view. No acute osseous abnormalities are identified. Contrast is seen within the bladder. IMPRESSION: Colon filled with air and small to moderate amount of stool. No free intra-abdominal air seen. Electronically Signed   By: Garald Balding M.D.   On: 04/14/2016 01:18   Ct Abdomen Pelvis W Contrast  Result Date: 04/13/2016 CLINICAL DATA:  75 year old male with  right side abdominal pain and diarrhea. Initial encounter. EXAM: CT ABDOMEN AND PELVIS WITH CONTRAST TECHNIQUE: Multidetector CT imaging of the abdomen and pelvis was performed using the standard protocol following bolus administration of intravenous contrast. CONTRAST:  160m ISOVUE-300 IOPAMIDOL (ISOVUE-300) INJECTION 61% COMPARISON:  Abdomen ultrasound 10/08/2015. FINDINGS: Partially visible 6 mm right lung nodule on series 4, image 1. Mild costophrenic angle scarring/ atelectasis. No pericardial or pleural effusion. Degenerative changes in the spine. Osteopenia. No acute osseous abnormality identified. The rectum is distended with fluid. The sigmoid colon is highly redundant and is gas and fluid distended tracking into the right upper quadrant. Following the distal sigmoid colon towards the rectum there is a swirling of the mesentery (see coronal images series 5, images 30 through 46). The left colon is mildly distended with gas, fluid, and some stool but otherwise negative. The transverse colon is redundant and distended with gas and stool. The proximal transverse and right colon contains stool throughout but are somewhat less distended. Negative cecum aside from retained stool. Negative terminal ileum. No dilated small bowel. Decompressed stomach and duodenum. Small volume abdominal free fluid in the right upper quadrant and tracking along the redundant sigmoid colon and in the sigmoid mesentery. no abdominal free air. Liver, gallbladder, spleen, pancreas and adrenal glands are within normal limits. The portal venous system is patent. No portal venous gas. Aortoiliac calcified atherosclerosis noted. Major arterial structures are patent. No lymphadenopathy. IMPRESSION: 1. Positive for Sigmoid Volvulus. This is best demonstrated on the coronal images (coronal images 30-46). The sigmoid colon is highly redundant and is gas distended tracking up into the right upper quadrant, rather than the left upper quadrant as  is more typical for sigmoid volvulus. 2. Small volume of free fluid in the right upper quadrant and in the sigmoid mesenteric. No free air. 3. Highly redundant more proximal colon also distended with gas, fluid, and stool. No dilated small bowel. 4. Partially visible right lung nodule in the middle lobe on image 1. Recommend non-emergent follow-up chest CT (noncontrast should suffice). Electronically Signed   By: HGenevie AnnM.D.   On: 04/13/2016 21:01    Review of Systems  Unable to perform ROS: Patient nonverbal   Blood pressure (!) 146/71, pulse 70, temperature 98.2 F (36.8 C), temperature source Oral, resp. rate 19, weight 58.4 kg (128 lb 12 oz), SpO2 100 %. Physical Exam  Constitutional: He appears well-developed and well-nourished. No distress.  HENT:  Head: Normocephalic and atraumatic.  Right Ear: External ear normal.  Left Ear: External ear normal.  Nose: Nose normal.  Mouth/Throat: Oropharynx is clear and moist. No oropharyngeal exudate.  Eyes: Pupils are equal, round, and reactive to light. Right eye exhibits no discharge. Left eye exhibits no discharge. No scleral icterus.  Neck: Normal range of motion. No tracheal deviation present.  Cardiovascular: Normal rate, regular rhythm, normal heart sounds and intact distal pulses.   No murmur heard. Respiratory: Effort normal and breath sounds normal. No respiratory distress. He has no wheezes.  GI: Soft. There is no tenderness.  Abdomen is a little full  Musculoskeletal: Normal range of motion. He exhibits no edema.  Lymphadenopathy:    He has no cervical adenopathy.  Neurological: He is alert.  Aphasic and quadriplegic  Skin: Skin is warm and dry. No erythema. No pallor.  Psychiatric: His behavior is normal.    Assessment/Plan: Sigmoid volvulus  It has now been reduced by endoscopy. The  main issue is whether or not the patient and his wife want him to have definitive surgery here in [place] or closer to home. We will have to  discuss this with his wife when she arrives. He seems uncertain. Again, I discussed the diagnosis with him and the reasons for performing an elective partial colectomy to prevent further recurrences of the volvulus although an ostomy in his situation may actually be helpful given his current neurologic status  Paschal Blanton A 04/14/2016, 6:20 AM

## 2016-04-14 NOTE — ED Notes (Signed)
GI MD at beside performing Flexible sigmoidoscopy

## 2016-04-14 NOTE — Brief Op Note (Signed)
04/13/2016 - 04/14/2016  12:39 AM  PATIENT:  Bill LentoWilliam M Swarey  75 y.o. male  PRE-OPERATIVE DIAGNOSIS:  sigmoid volvulus  POST-OPERATIVE DIAGNOSIS:  Flex sig -Sigmoid Volvulus status post decompression  PROCEDURE:  Procedure(s): FLEXIBLE SIGMOIDOSCOPY (Left)  SURGEON:  Surgeon(s) and Role:    * Kathi DerParag Jozeph Persing, MD - Primary   PLAN OF CARE:   - Nothing by mouth for now - Surgical consultation has been placed - GI will follow

## 2016-04-15 MED ORDER — DEXTROSE-NACL 5-0.45 % IV SOLN
INTRAVENOUS | Status: DC
  Administered 2016-04-15 – 2016-04-18 (×7): via INTRAVENOUS
  Filled 2016-04-15: qty 1000

## 2016-04-15 MED ORDER — BOOST / RESOURCE BREEZE PO LIQD
1.0000 | Freq: Three times a day (TID) | ORAL | Status: DC
  Administered 2016-04-15 – 2016-04-23 (×16): 1 via ORAL

## 2016-04-15 NOTE — Progress Notes (Signed)
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 and admissions for aspiration/2014 Hyperlipidemia B12 deficiency Urinary incontinence History of lower GI bleed-diverticular bleed vs hemorrhoids 12/2013  Admitted to [facility] 04/13/16 abd pain/discomfort + found on Ct abd to have Sig volvulus  GI consulted and volvulus reduced by flex Sig Gen surgery evaluating for need for surgery   Past medical history-As per Problem list Chart reviewed as below-   Consultants:  Gastroenterology  General surgery    Procedures:  Sigmoid decompression  Antibiotics:  None currently   Subjective   Non communicative Nursing and family state no new issues with po,liquids Pleasant Vs/labs reviewed   Objective    Interim History:   Telemetry:    Objective: Vitals:   04/14/16 1700 04/14/16 2057 04/15/16 0549 04/15/16 0900  BP: (!) 144/76 136/67 123/62 135/67  Pulse: 74 61 71 81  Resp: 18 17 17 18   Temp: 98.3 F (36.8 C) 98 F (36.7 C) 97.9 F (36.6 C) 98.4 F (36.9 C)  TempSrc: Axillary Oral Oral Oral  SpO2: 100% 96% 98% 98%  Weight:  59.8 kg (131 lb 14.4 oz)      Intake/Output Summary (Last 24 hours) at 04/15/16 1224 Last data filed at 04/15/16 0900  Gross per 24 hour  Intake          1896.66 ml  Output              875 ml  Net          1021.66 ml    Exam:  General: eomi ncat, aphasic-smiling Cardiovascular:  s1 s2 no m/r/g Respiratory: clear no added sound Abdomen:  Soft nt nd- nodistension, no rebound, no gaurd Skin no le edema Neuro intact but aphasic  Data Reviewed: Basic Metabolic Panel:  Recent Labs Lab 04/13/16 1725 04/14/16 0232  NA 141 141  K 3.0* 3.4*  CL 105 108  CO2 25 26  GLUCOSE 108* 92  BUN 9 8    CREATININE 0.71 0.76  CALCIUM 9.0 7.9*   Liver Function Tests:  Recent Labs Lab 04/13/16 1725  AST 22  ALT 17  ALKPHOS 77  BILITOT 1.2  PROT 7.1  ALBUMIN 4.2    Recent Labs Lab 04/13/16 1725  LIPASE 18   No results for input(s): AMMONIA in the last 168 hours. CBC:  Recent Labs Lab 04/13/16 1725 04/14/16 0232  WBC 11.8* 8.4  HGB 15.4 12.7*  HCT 47.0 39.8  MCV 95.1 95.7  PLT 121* 98*   Cardiac Enzymes: No results for input(s): CKTOTAL, CKMB, CKMBINDEX, TROPONINI in the last 168 hours. BNP: Invalid input(s): POCBNP CBG: No results for input(s): GLUCAP in the last 168 hours.  No results found for this or any previous visit (from the past 240 hour(s)).   Studies:              All Imaging reviewed and is as per above notation   Scheduled Meds: . aspirin  81 mg Oral Daily  . enoxaparin (LOVENOX) injection  40 mg Subcutaneous Daily  . sodium chloride flush  3 mL Intravenous Q12H   Continuous Infusions: . dextrose 5 % and 0.45% NaCl 100 mL/hr at 04/15/16 1204     Assessment/Plan:  Sigmoid volvulus -Patient with subacute onset of abdominal pain found to have  volvulus on CT -GI did flexible sigmoidoscopy toreduce  -?may need surgical repair; gen surg input appreciated and await final discussions with rest of family coming into town.  They are aware a colostomy might be required -Leukocytosis resolved  H/o CVA -Severe debility, completely dependent for ADLs -Only takes ASA daily (will continue) -Was prescribed Lipitor at last hospitalization ([facility] or [facility]), does not appear to be taking it -Unclear benefit from HTN/HLD control, wouldn't probably chenge long-term outcomes  Pulmonary nodule -Consider outpatient f/u  Hypokalemia -Repleted in ER -recheck in am  Inpatient pending final discussions re: need for colectomy this admit Discussed with wife and grand-son at the bedside today   Pleas KochJai Jerianne Anselmo, MD  [facility] Pager [phone] 04/15/2016,  12:24 PM    LOS: 2 days

## 2016-04-15 NOTE — Progress Notes (Signed)
Patient ID: Bill LentoWilliam M Borah, male   DOB: 03/02/1941, 75 y.o.   MRN: [id number]  [facility]  1 Day Post-Op  Subjective: Family at bedside. Unsure how they want to proceed (surgery here vs [place], +/- colostomy), more family is coming later today so they will discuss amongst themselves.  Objective: Vital signs in last 24 hours: Temp:  [97.9 F (36.6 C)-98.3 F (36.8 C)] 97.9 F (36.6 C) (09/05 0549) Pulse Rate:  [61-74] 71 (09/05 0549) Resp:  [17-18] 17 (09/05 0549) BP: (123-152)/(62-76) 123/62 (09/05 0549) SpO2:  [96 %-100 %] 98 % (09/05 0549) Weight:  [131 lb 14.4 oz (59.8 kg)] 131 lb 14.4 oz (59.8 kg) (09/04 2057) Last BM Date: 04/14/16  Intake/Output from previous day: 09/04 0701 - 09/05 0700 In: 2296.7 [P.O.:240; I.V.:2056.7] Out: 725 [Urine:725] Intake/Output this shift: No intake/output data recorded.  PE: Gen:  Alert, NAD, lying comfortably in bed Card:  RRR Pulm:  CTAB, effort nonlabored Abd: Soft, NT, mildly distended, +BS Neuro: aphasic, quadriplegic  Lab Results:   Recent Labs  04/13/16 1725 04/14/16 0232  WBC 11.8* 8.4  HGB 15.4 12.7*  HCT 47.0 39.8  PLT 121* 98*   BMET  Recent Labs  04/13/16 1725 04/14/16 0232  NA 141 141  K 3.0* 3.4*  CL 105 108  CO2 25 26  GLUCOSE 108* 92  BUN 9 8  CREATININE 0.71 0.76  CALCIUM 9.0 7.9*   PT/INR No results for input(s): LABPROT, INR in the last 72 hours. CMP     Component Value Date/Time   NA 141 04/14/2016 0232   NA 141 12/28/2013 0555   K 3.4 (L) 04/14/2016 0232   K 3.7 12/28/2013 0555   CL 108 04/14/2016 0232   CL 107 12/28/2013 0555   CO2 26 04/14/2016 0232   CO2 29 12/28/2013 0555   GLUCOSE 92 04/14/2016 0232   GLUCOSE 75 12/28/2013 0555   BUN 8 04/14/2016 0232   BUN 7 12/28/2013 0555   CREATININE 0.76 04/14/2016 0232   CREATININE 0.76 12/28/2013 0555   CALCIUM 7.9 (L) 04/14/2016 0232   CALCIUM 8.1 (L) 12/28/2013 0555   PROT 7.1 04/13/2016 1725   PROT  7.4 12/27/2013 1619   ALBUMIN 4.2 04/13/2016 1725   ALBUMIN 3.7 12/27/2013 1619   AST 22 04/13/2016 1725   AST 15 12/27/2013 1619   ALT 17 04/13/2016 1725   ALT 15 12/27/2013 1619   ALKPHOS 77 04/13/2016 1725   ALKPHOS 106 12/27/2013 1619   BILITOT 1.2 04/13/2016 1725   BILITOT 0.3 12/27/2013 1619   GFRNONAA >60 04/14/2016 0232   GFRNONAA >60 12/28/2013 0555   GFRAA >60 04/14/2016 0232   GFRAA >60 12/28/2013 0555   Lipase     Component Value Date/Time   LIPASE 18 04/13/2016 1725       Studies/Results: Dg Abdomen 1 View  Result Date: 04/14/2016 CLINICAL DATA:  Status post colonoscopy.  Initial encounter. EXAM: ABDOMEN - 1 VIEW COMPARISON:  CT of the abdomen and pelvis performed 04/13/2016 FINDINGS: The colon is filled with air and a small to moderate amount of stool. No free intra-abdominal air is seen, though evaluation for free air is limited on a single supine view. No acute osseous abnormalities are identified. Contrast is seen within the bladder. IMPRESSION: Colon filled with air and small to moderate amount of stool. No free intra-abdominal air seen. Electronically Signed   By: Roanna RaiderJeffery  Chang M.D.   On: 04/14/2016 01:18   Ct Abdomen  Pelvis W Contrast  Result Date: 04/13/2016 CLINICAL DATA:  75 year old male with right side abdominal pain and diarrhea. Initial encounter. EXAM: CT ABDOMEN AND PELVIS WITH CONTRAST TECHNIQUE: Multidetector CT imaging of the abdomen and pelvis was performed using the standard protocol following bolus administration of intravenous contrast. CONTRAST:  ISOVUE-300 IOPAMIDOL (ISOVUE-300) INJECTION 61% COMPARISON:  Abdomen ultrasound 10/08/2015. FINDINGS: Partially visible 6 mm right lung nodule on series 4, image 1. Mild costophrenic angle scarring/ atelectasis. No pericardial or pleural effusion. Degenerative changes in the spine. Osteopenia. No acute osseous abnormality identified. The rectum is distended with fluid. The sigmoid colon is highly  redundant and is gas and fluid distended tracking into the right upper quadrant. Following the distal sigmoid colon towards the rectum there is a swirling of the mesentery (see coronal images series 5, images 30 through 46). The left colon is mildly distended with gas, fluid, and some stool but otherwise negative. The transverse colon is redundant and distended with gas and stool. The proximal transverse and right colon contains stool throughout but are somewhat less distended. Negative cecum aside from retained stool. Negative terminal ileum. No dilated small bowel. Decompressed stomach and duodenum. Small volume abdominal free fluid in the right upper quadrant and tracking along the redundant sigmoid colon and in the sigmoid mesentery. no abdominal free air. Liver, gallbladder, spleen, pancreas and adrenal glands are within normal limits. The portal venous system is patent. No portal venous gas. Aortoiliac calcified atherosclerosis noted. Major arterial structures are patent. No lymphadenopathy. IMPRESSION: 1. Positive for Sigmoid Volvulus. This is best demonstrated on the coronal images (coronal images 30-46). The sigmoid colon is highly redundant and is gas distended tracking up into the right upper quadrant, rather than the left upper quadrant as is more typical for sigmoid volvulus. 2. Small volume of free fluid in the right upper quadrant and in the sigmoid mesenteric. No free air. 3. Highly redundant more proximal colon also distended with gas, fluid, and stool. No dilated small bowel. 4. Partially visible right lung nodule in the middle lobe on image 1. Recommend non-emergent follow-up chest CT (noncontrast should suffice). Electronically Signed   By: Odessa Fleming M.D.   On: 04/13/2016 21:01    Anti-infectives: Anti-infectives    None       Assessment/Plan Sigmoid volvulus -s/p reduction via flexible sigmoidoscopy yesterday -benign exam today -more family coming today. Likely will stay at [facility] for  surgery rather than returning to [facility], but want to discuss once family arrives. Have concerns about ostomy. H/o CVA, quadriplegia Mild hypokalemia - 3.4 yesterday, labs pending today  FEN - IVF, clear liquids VTE - lovenox ID - none  Plan - more family coming today to discuss how they wish to proceed, but will likely stay at [facility] for surgery. Apprehensive about ostomy.   LOS: 2 days    Edson Snowball , [facility] 04/15/2016, 10:02 AM Pager: [phone] Consults: [phone] Mon-Fri 7:00 am-4:30 pm Sat-Sun 7:00 am-11:30 am

## 2016-04-15 NOTE — Progress Notes (Signed)
Patients family refused lab draw; CBC and BMP, no reason given. MD notified of refusal. Will continue to monitor. Ileene RubensAnisha, RN

## 2016-04-15 NOTE — Progress Notes (Signed)
Initial Nutrition Assessment  DOCUMENTATION CODES:   Not applicable  INTERVENTION:  Provide Boost Breeze po TID, each supplement provides 250 kcal and 9 grams of protein.  Encourage adequate PO intake.   Recommend obtaining height measurement.  NUTRITION DIAGNOSIS:   Increased nutrient needs related to acute illness as evidenced by estimated needs.  GOAL:   Patient will meet greater than or equal to 90% of their needs  MONITOR:   PO intake, Supplement acceptance, Diet advancement, Labs, Weight trends, Skin, I & O's  REASON FOR ASSESSMENT:   Low Braden    ASSESSMENT:   75 y.o. 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 Pt nonverbal, quadriplegic.    Family at bedside. Pt unable to respond to questions asked. Family reports pt was previously on a pureed diet due to dysphagia and usually consumes 3 meals a day. Pt consumes a nutritional/ protein supplements that are similar to Magic cup at least once daily. Usual body weight unknown. Pt has been advanced to a clear liquid diet. Meal completion 75% at lunch today. RD to order Boost Breeze to aid in caloric and protein needs. Nutrition focused physical exam deferred at this time as pt with quadriplegia.   Labs and medications reviewed.   Diet Order:  Diet clear liquid Room service appropriate? Yes; Fluid consistency: Thin  Skin:  Wound (see comment) (wound on buttocks)  Last BM:  9/4  Height:   Ht Readings from Last 1 Encounters:  No data found for Ht    Weight:   Wt Readings from Last 1 Encounters:  04/14/16 131 lb 14.4 oz (59.8 kg)    Ideal Body Weight:    N/A  BMI:  There is no height or weight on file to calculate BMI.  Estimated Nutritional Needs:   Kcal:  1600-1800  Protein:  70-85 grams  Fluid:  1.6 - 1.8 L/day  EDUCATION NEEDS:   No education needs identified at this time  Roslyn SmilingStephanie Amylee Lodato,  MS, RD, LDN Pager # [phone] After hours/ weekend pager # [phone]

## 2016-04-16 LAB — C DIFFICILE QUICK SCREEN W PCR REFLEX
C Diff antigen: NEGATIVE
C Diff interpretation: NOT DETECTED
C Diff toxin: NEGATIVE

## 2016-04-16 LAB — GLUCOSE, CAPILLARY: Glucose-Capillary: 126 mg/dL — ABNORMAL HIGH (ref 65–99)

## 2016-04-16 MED ORDER — SODIUM CHLORIDE 0.9 % IV BOLUS (SEPSIS)
500.0000 mL | Freq: Once | INTRAVENOUS | Status: AC
Start: 2016-04-16 — End: 2016-04-16
  Administered 2016-04-16: 500 mL via INTRAVENOUS

## 2016-04-16 MED ORDER — DEXTROSE 5 % IV SOLN
1.0000 g | INTRAVENOUS | Status: DC
  Administered 2016-04-16 – 2016-04-22 (×6): 1 g via INTRAVENOUS
  Filled 2016-04-16 (×9): qty 10

## 2016-04-16 MED ORDER — POTASSIUM CHLORIDE CRYS ER 20 MEQ PO TBCR
40.0000 meq | EXTENDED_RELEASE_TABLET | Freq: Once | ORAL | Status: AC
  Administered 2016-04-16: 40 meq via ORAL
  Filled 2016-04-16: qty 2

## 2016-04-16 NOTE — Consult Note (Addendum)
WOC consult requested by surgical team to discuss ostomy pouching routines prior to a possible surgery.  Educational materials left at the bedside and briefly discussed pouch changes and emptying with wife at the bedside, demonstrated ostomy pouch appearance. Pt's wife states her daughter is a Engineer, civil (consulting)nurse and they have discussed the possibility of ostomy placement during surgery and admits that she is not very receptive to the plan but agrees "I am thinking about it."  Please re-consult if further assistance is needed.  Thank-you,  Cammie Mcgeeawn Hurley Sobel MSN, RN, CWOCN, OverlyWCN-AP, CNS [phone]

## 2016-04-16 NOTE — Progress Notes (Signed)
[facility] Progress Note  2 Days Post-Op  Subjective: Patient seems comfortable   fever last night Significant amount of diarrhea last night  Objective: Vital signs in last 24 hours: Temp:  [98.5 F (36.9 C)-101.4 F (38.6 C)] 98.7 F (37.1 C) (09/06 0848) Pulse Rate:  [66-78] 76 (09/06 0848) Resp:  [16-18] 16 (09/06 0848) BP: (115-142)/(54-65) 115/59 (09/06 0848) SpO2:  [92 %-99 %] 96 % (09/06 0848) Weight:  [61.3 kg (135 lb 3.2 oz)] 61.3 kg (135 lb 3.2 oz) (09/05 2139) Last BM Date: 04/15/16  Intake/Output from previous day: 09/05 0701 - 09/06 0700 In: 1020 [P.O.:520] Out: 700 [Urine:700] Intake/Output this shift: Total I/O In: 1480 [P.O.:480; I.V.:1000] Out: -   PE: Gen:  Alert, NAD, lying comfortably in bed Card:  RRR Pulm:  CTAB, effort nonlabored Abd: Soft, NT, mildly distended, +BS Neuro: aphasic, quadriplegic  Lab Results:   Recent Labs  04/13/16 1725 04/14/16 0232  WBC 11.8* 8.4  HGB 15.4 12.7*  HCT 47.0 39.8  PLT 121* 98*   BMET  Recent Labs  04/13/16 1725 04/14/16 0232  NA 141 141  K 3.0* 3.4*  CL 105 108  CO2 25 26  GLUCOSE 108* 92  BUN 9 8  CREATININE 0.71 0.76  CALCIUM 9.0 7.9*   PT/INR No results for input(s): LABPROT, INR in the last 72 hours. CMP     Component Value Date/Time   NA 141 04/14/2016 0232   NA 141 12/28/2013 0555   K 3.4 (L) 04/14/2016 0232   K 3.7 12/28/2013 0555   CL 108 04/14/2016 0232   CL 107 12/28/2013 0555   CO2 26 04/14/2016 0232   CO2 29 12/28/2013 0555   GLUCOSE 92 04/14/2016 0232   GLUCOSE 75 12/28/2013 0555   BUN 8 04/14/2016 0232   BUN 7 12/28/2013 0555   CREATININE 0.76 04/14/2016 0232   CREATININE 0.76 12/28/2013 0555   CALCIUM 7.9 (L) 04/14/2016 0232   CALCIUM 8.1 (L) 12/28/2013 0555   PROT 7.1 04/13/2016 1725   PROT 7.4 12/27/2013 1619   ALBUMIN 4.2 04/13/2016 1725   ALBUMIN 3.7 12/27/2013 1619   AST 22 04/13/2016 1725   AST 15 12/27/2013 1619   ALT 17 04/13/2016 1725    ALT 15 12/27/2013 1619   ALKPHOS 77 04/13/2016 1725   ALKPHOS 106 12/27/2013 1619   BILITOT 1.2 04/13/2016 1725   BILITOT 0.3 12/27/2013 1619   GFRNONAA >60 04/14/2016 0232   GFRNONAA >60 12/28/2013 0555   GFRAA >60 04/14/2016 0232   GFRAA >60 12/28/2013 0555   Lipase     Component Value Date/Time   LIPASE 18 04/13/2016 1725   Studies/Results: No results found.  Anti-infectives: Anti-infectives    None     Assessment/Plan Sigmoid volvulus -s/p reduction via flexible sigmoidoscopy Sunday -benign exam today - patient likely needs sigmoid resection +/- descending colostomy.  Ostomy may make his care easier, but family has some reservations about colostomy care - WOC to see patient today for education  H/o CVA, quadriplegia Mild hypokalemia - 3.4 yesterday, labs pending today  FEN - IVF, clear liquids VTE - lovenox ID - none  Plan - Will revisit with family tomorrow to see if any decisions have been made regarding surgery.  Regardless of the plan, we cannot guarantee that he will not have a colostomy after sigmoid resection.  The decision will be made intra-operatively depending on the condition of the colon/ rectum.     LOS: 3 days  Adam PhenixElizabeth S Simaan , [facility] 04/16/2016, 10:58 AM Pager: [phone] Consults: [phone] Mon-Fri 7:00 am-4:30 pm Sat-Sun 7:00 am-11:30 am   Wilmon ArmsMatthew K. Corliss Skainssuei, MD, [facility]  General/ Trauma Surgery  04/16/2016 11:12 AM

## 2016-04-16 NOTE — Progress Notes (Signed)
Patient's axillary temperature at 0023 was 101.4.  RN administered 650mg  of PRN Tylenol per MD order.  Temperature at recheck was 100.4 axillary. [facility] text paged with this information via Amion

## 2016-04-16 NOTE — Progress Notes (Signed)
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  They are aware a colostomy might be required  Gen. surgery following.  Fever  Likely from UTI Patient came with a abnormal UA which showed positive nitrite. Will start ceftriaxone 1 g IV daily. Obtain urine culture , blood culture 2.  H/o CVA -Severe debility, completely dependent for ADLs -Only takes ASA daily (will continue) -Was prescribed Lipitor at last hospitalization ([facility] or [facility]), does not appear to be taking it -Unclear benefit from HTN/HLD control, wouldn't probably chenge long-term outcomes  Pulmonary nodule -Consider outpatient f/u  Hypokalemia  potassium is 3.4. Replace  potassium and check BMP in a.m.    DVT prophylaxis: Lovenox Code Status: Full code Family Communication: Discussed with patient's wife at bedside Disposition Plan: Pending general surgery recommendations   Consultants:  Gastroenterology  Gen. Surgery   Procedures:  Sigmoid decompression  Antibiotics:  None    Subjective   Patient was febrile last  night with temp of 101.4. UA on admission showed positive nitrate. Family meeting to discuss the general surgery regarding options for surgery.  Objective    Objective: Vitals:   04/16/16 0023 04/16/16 0155 04/16/16 0428 04/16/16 0848  BP:   (!) 125/54 (!) 115/59  Pulse:   78 76  Resp:   16 16  Temp: (!) 101.4 F (38.6 C) (!) 100.4 F (38 C) 98.6 F (37 C) 98.7 F (37.1 C)  TempSrc: Axillary Axillary Oral Oral  SpO2:   97% 96%  Weight:        Intake/Output Summary (Last 24 hours) at 04/16/16 1529 Last data filed at 04/16/16 0848  Gross per 24 hour  Intake             2260 ml  Output              450 ml  Net             1810 ml   Filed Weights   04/14/16 0143 04/14/16 2057 04/15/16 2139  Weight: 58.4 kg (128 lb 12 oz) 59.8 kg (131 lb 14.4 oz) 61.3 kg (135 lb 3.2 oz)    Examination:  General exam: Appears calm and comfortable  Respiratory system: Clear to auscultation. Respiratory effort normal. Cardiovascular system: S1 & S2 heard, RRR. No JVD, murmurs, rubs, gallops or clicks. No pedal edema. Gastrointestinal system: Abdomen is nondistended, soft and nontender. No organomegaly or masses felt. Normal bowel sounds heard. Central nervous system: Nonverbal. No focal neurological deficits. Extremities: Symmetric 5 x 5 power. Skin: No rashes, lesions or ulcers Psychiatry: Judgement and insight appear normal. Mood & affect appropriate.    Data Reviewed: I have personally  reviewed following labs and imaging studies Basic Metabolic Panel:  Recent Labs Lab 04/13/16 1725 04/14/16 0232  NA 141 141  K 3.0* 3.4*  CL 105 108  CO2 25 26  GLUCOSE 108* 92  BUN 9 8  CREATININE 0.71 0.76  CALCIUM 9.0 7.9*   Liver Function Tests:  Recent Labs Lab 04/13/16 1725  AST 22  ALT 17  ALKPHOS 77  BILITOT 1.2  PROT 7.1  ALBUMIN 4.2    Recent Labs Lab 04/13/16 1725  LIPASE 18   No results for input(s): AMMONIA in the last 168 hours. CBC:  Recent Labs Lab  04/13/16 1725 04/14/16 0232  WBC 11.8* 8.4  HGB 15.4 12.7*  HCT 47.0 39.8  MCV 95.1 95.7  PLT 121* 98*   Cardiac Enzymes: No results for input(s): CKTOTAL, CKMB, CKMBINDEX, TROPONINI in the last 168 hours. BNP (last 3 results) No results for input(s): BNP in the last 8760 hours.  ProBNP (last 3 results) No results for input(s): PROBNP in the last 8760 hours.  CBG: No results for input(s): GLUCAP in the last 168 hours.  Recent Results (from the past 240 hour(s))  C difficile quick scan w PCR reflex     Status: None   Collection Time: 04/15/16 10:35 PM  Result Value Ref Range Status   C Diff antigen NEGATIVE NEGATIVE Final   C Diff toxin NEGATIVE NEGATIVE Final   C Diff interpretation No C. difficile detected.  Final     Studies: No results found.  Scheduled Meds: . aspirin  81 mg Oral Daily  . enoxaparin (LOVENOX) injection  40 mg Subcutaneous Daily  . feeding supplement  1 Container Oral TID BM  . sodium chloride flush  3 mL Intravenous Q12H   Continuous Infusions: . dextrose 5 % and 0.45% NaCl 100 mL/hr at 04/16/16 1111       Time spent: 25 min    [facility]  [facility] Pager [phone]. If 7PM-7AM, please contact night-coverage at [email or web address], Office  [phone]  password [email or web address] 04/16/2016, 3:29 PM  LOS: 3 days

## 2016-04-17 LAB — BASIC METABOLIC PANEL
ANION GAP: 12 (ref 5–15)
BUN: <5 mg/dL — ABNORMAL LOW (ref 6–20)
CALCIUM: 7.4 mg/dL — AB (ref 8.9–10.3)
CO2: 24 mmol/L (ref 22–32)
Chloride: 102 mmol/L (ref 101–111)
Creatinine, Ser: 0.71 mg/dL (ref 0.61–1.24)
GFR calc Af Amer: >60 mL/min (ref >60)
GLUCOSE: 91 mg/dL (ref 65–99)
Potassium: 2.4 mmol/L — CL (ref 3.5–5.1)
Sodium: 138 mmol/L (ref 135–145)

## 2016-04-17 MED ORDER — ENOXAPARIN SODIUM 40 MG/0.4ML ~~LOC~~ SOLN
40.0000 mg | Freq: Every day | SUBCUTANEOUS | Status: DC
  Administered 2016-04-19 – 2016-04-23 (×5): 40 mg via SUBCUTANEOUS
  Filled 2016-04-17 (×5): qty 0.4

## 2016-04-17 MED ORDER — POTASSIUM CHLORIDE 10 MEQ/100ML IV SOLN
10.0000 meq | INTRAVENOUS | Status: AC
  Administered 2016-04-17 (×5): 10 meq via INTRAVENOUS
  Filled 2016-04-17 (×3): qty 100

## 2016-04-17 MED ORDER — POTASSIUM CHLORIDE 10 MEQ/100ML IV SOLN
10.0000 meq | INTRAVENOUS | Status: AC
  Administered 2016-04-17: 10 meq via INTRAVENOUS
  Filled 2016-04-17: qty 100

## 2016-04-17 NOTE — Progress Notes (Signed)
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 and admissions for aspiration/2014 Hyperlipidemia B12 deficiency Urinary incontinence History of lower GI bleed-diverticular bleed vs hemorrhoids 12/2013  [facility]d pain/discomfort + found on Ct abd to have Sig volvulus  GI consulted and volvulus reduced by flex Sig Gen surgery evaluating for need for surgery     Assessment/Plan:    Sigmoid volvulus -Patient with subacute onset of abdominal pain found to have volvulus on CT -GI did flexible sigmoidoscopy to reduce  -?may need surgical repair; gen surg input appreciated and await final discussions with rest of family coming into town.  They are aware a colostomy might be required  Gen. surgery following and plan for sigmoid colectomy with colostomy in am.  Fever  Likely from UTI Patient came with a abnormal UA which showed positive nitrite.  ceftriaxone 1 g IV daily.  urine culture , blood culture 2.  H/o CVA -Severe debility, completely dependent for ADLs -Only takes ASA daily (will continue) -Was prescribed Lipitor at last hospitalization ([facility]), does not appear to be taking it -Unclear benefit from HTN/HLD control, wouldn't probably chenge long-term outcomes  Pulmonary nodule -Consider outpatient f/u  Hypokalemia  potassium is 2.4. Replace  potassium and check BMP in a.m.    DVT prophylaxis: Lovenox Code Status: Full code Family Communication: Discussed with patient's wife at bedside Disposition Plan: Pending general surgery recommendations   Consultants:  Gastroenterology  Gen. Surgery   Procedures:  Sigmoid decompression  Antibiotics:  None     Subjective   Patient seen and examined, plan for surgery in am. Potassium is 2.4 today.  Objective    Objective: Vitals:   04/16/16 1708 04/16/16 2016 04/17/16 0535 04/17/16 0921  BP: 129/64 (!) 156/71 (!) 154/77 (!) 152/69  Pulse: 79 80 77 65  Resp: 16 16 17 17   Temp: 98.5 F (36.9 C) 99.2 F (37.3 C) 98.5 F (36.9 C) 98.2 F (36.8 C)  TempSrc: Oral Oral Oral Axillary  SpO2: 96% 99% 97% 100%  Weight:        Intake/Output Summary (Last 24 hours) at 04/17/16 1154 Last data filed at 04/17/16 0930  Gross per 24 hour  Intake             2050 ml  Output              825 ml  Net             1225 ml   Filed Weights   04/14/16 0143 04/14/16 2057 04/15/16 2139  Weight: 58.4 kg (128 lb 12 oz) 59.8 kg (131 lb 14.4 oz) 61.3 kg (135 lb 3.2 oz)    Examination:  General exam: Appears calm and comfortable  Respiratory system: Clear to auscultation. Respiratory effort normal. Cardiovascular system: S1 & S2 heard, RRR. No JVD, murmurs, rubs, gallops or clicks. No pedal edema. Gastrointestinal system: Abdomen is nondistended, soft and nontender. No organomegaly or masses felt. Normal bowel sounds heard. Central nervous system: Nonverbal. No focal neurological deficits. Extremities: Symmetric 5 x 5 power. Skin: No rashes, lesions or ulcers Psychiatry: Judgement and insight appear normal. Mood & affect appropriate.    Data Reviewed: I have personally reviewed following labs and imaging studies  Basic Metabolic Panel:  Recent Labs Lab 04/13/16 1725 04/14/16 0232 04/17/16 0418  NA 141 141 138  K 3.0* 3.4* 2.4*  CL 105 108 102  CO2 25 26 24   GLUCOSE 108* 92 91  BUN 9 8 <5*  CREATININE 0.71 0.76 0.71  CALCIUM 9.0 7.9* 7.4*   Liver Function Tests:  Recent Labs Lab 04/13/16 1725  AST 22  ALT 17  ALKPHOS 77  BILITOT 1.2  PROT 7.1  ALBUMIN 4.2    Recent Labs Lab 04/13/16 1725  LIPASE 18   No results for input(s): AMMONIA in the last 168 hours. CBC:  Recent  Labs Lab 04/13/16 1725 04/14/16 0232  WBC 11.8* 8.4  HGB 15.4 12.7*  HCT 47.0 39.8  MCV 95.1 95.7  PLT 121* 98*   Cardiac Enzymes: No results for input(s): CKTOTAL, CKMB, CKMBINDEX, TROPONINI in the last 168 hours. BNP (last 3 results) No results for input(s): BNP in the last 8760 hours.  ProBNP (last 3 results) No results for input(s): PROBNP in the last 8760 hours.  CBG:  Recent Labs Lab 04/16/16 2202  GLUCAP 126*    Recent Results (from the past 240 hour(s))  C difficile quick scan w PCR reflex     Status: None   Collection Time: 04/15/16 10:35 PM  Result Value Ref Range Status   C Diff antigen NEGATIVE NEGATIVE Final   C Diff toxin NEGATIVE NEGATIVE Final   C Diff interpretation No C. difficile detected.  Final     Studies: No results found.  Scheduled Meds: . aspirin  81 mg Oral Daily  . cefTRIAXone (ROCEPHIN)  IV  1 g Intravenous Q24H  . [START ON 04/18/2016] enoxaparin (LOVENOX) injection  40 mg Subcutaneous Daily  . feeding supplement  1 Container Oral TID BM  . potassium chloride  10 mEq Intravenous Q1 Hr x 5  . sodium chloride flush  3 mL Intravenous Q12H   Continuous Infusions: . dextrose 5 % and 0.45% NaCl 100 mL/hr at 04/17/16 0353       Time spent: 25 min    [facility]  [facility] Pager [phone]. If 7PM-7AM, please contact night-coverage at [email or web address], Office  [phone]  password [email or web address] 04/17/2016, 11:54 AM  LOS: 4 days

## 2016-04-17 NOTE — Progress Notes (Addendum)
3 Days Post-Op  Subjective: Patient resting comfortably No events overnight K is 2.4 - receiving five runs of KCl By nursing report, family has made decision regarding surgery, but no family at bedside right now.  Objective: Vital signs in last 24 hours: Temp:  [98.5 F (36.9 C)-99.2 F (37.3 C)] 98.5 F (36.9 C) (09/07 0535) Pulse Rate:  [76-80] 77 (09/07 0535) Resp:  [16-17] 17 (09/07 0535) BP: (115-156)/(59-77) 154/77 (09/07 0535) SpO2:  [96 %-99 %] 97 % (09/07 0535) Last BM Date: 04/16/16  Intake/Output from previous day: 09/06 0701 - 09/07 0700 In: 3730 [P.O.:480; I.V.:3200; IV Piggyback:50] Out: 625 [Urine:625] Intake/Output this shift: No intake/output data recorded.  General appearance: no distress GI: soft, minimal distention; no obvious tenderness  Lab Results:  No results for input(s): WBC, HGB, HCT, PLT in the last 72 hours. BMET  Recent Labs  04/17/16 0418  NA 138  K 2.4*  CL 102  CO2 24  GLUCOSE 91  BUN <5*  CREATININE 0.71  CALCIUM 7.4*   PT/INR No results for input(s): LABPROT, INR in the last 72 hours. ABG No results for input(s): PHART, HCO3 in the last 72 hours.  Invalid input(s): PCO2, PO2  Studies/Results: No results found.  Anti-infectives: Anti-infectives    Start     Dose/Rate Route Frequency Ordered Stop   04/16/16 1600  cefTRIAXone (ROCEPHIN) 1 g in dextrose 5 % 50 mL IVPB     1 g 100 mL/hr over 30 Minutes Intravenous Every 24 hours 04/16/16 1539        Assessment/Plan: s/p Procedure(s): FLEXIBLE SIGMOIDOSCOPY (Left) Sigmoid volvulus s/p endoscopic reduction  Will discuss sigmoid resection with the family.  Surgery will not occur today because of the low K.  Replete K and we will tentatively plan for tomorrow.  I will come back when the family is here.  LOS: 4 days    Zenas Santa K. 04/17/2016    Discussed with wife - they would like to have the sigmoid colectomy without colostomy if possible.  We will plan  surgery for tomorrow if his K is normalized.  No need for bowel prep as patient has been on clear liquids for several days and has been having bowel movements.  Plan sigmoid colectomy for tomorrow.  The surgical procedure has been discussed with the patient.  Potential risks, benefits, alternative treatments, and expected outcomes have been explained.  All of the patient's questions at this time have been answered.  The likelihood of reaching the patient's treatment goal is good.  The patient understand the proposed surgical procedure and wishes to proceed.  Wilmon ArmsMatthew K. Corliss Skainssuei, MD, [facility]  General/ Trauma Surgery  04/17/2016 11:50 AM

## 2016-04-17 NOTE — Progress Notes (Signed)
CRITICAL LAB VALUE POTASSIUM: 2.4 On call NP notified.  Veatrice KellsMahmoud,Janiene Aarons I, RN

## 2016-04-18 LAB — BASIC METABOLIC PANEL
Anion gap: 12 (ref 5–15)
BUN: <5 mg/dL — ABNORMAL LOW (ref 6–20)
CALCIUM: 7.2 mg/dL — AB (ref 8.9–10.3)
CO2: 27 mmol/L (ref 22–32)
CREATININE: 0.7 mg/dL (ref 0.61–1.24)
Chloride: 98 mmol/L — ABNORMAL LOW (ref 101–111)
GFR calc non Af Amer: >60 mL/min (ref >60)
Glucose, Bld: 119 mg/dL — ABNORMAL HIGH (ref 65–99)
Potassium: 2.4 mmol/L — CL (ref 3.5–5.1)
SODIUM: 137 mmol/L (ref 135–145)

## 2016-04-18 LAB — NA AND K (SODIUM & POTASSIUM), RAND UR
POTASSIUM UR: 8 mmol/L
Sodium, Ur: 99 mmol/L

## 2016-04-18 LAB — POTASSIUM: POTASSIUM: 2.2 mmol/L — AB (ref 3.5–5.1)

## 2016-04-18 LAB — MAGNESIUM: MAGNESIUM: 1.6 mg/dL — AB (ref 1.7–2.4)

## 2016-04-18 MED ORDER — MAGNESIUM SULFATE 2 GM/50ML IV SOLN
2.0000 g | Freq: Once | INTRAVENOUS | Status: AC
  Administered 2016-04-18: 2 g via INTRAVENOUS
  Filled 2016-04-18: qty 50

## 2016-04-18 MED ORDER — POTASSIUM CHLORIDE 10 MEQ/100ML IV SOLN
10.0000 meq | INTRAVENOUS | Status: AC
  Administered 2016-04-18 (×6): 10 meq via INTRAVENOUS
  Filled 2016-04-18: qty 100

## 2016-04-18 MED ORDER — POTASSIUM CHLORIDE 2 MEQ/ML IV SOLN
INTRAVENOUS | Status: DC
  Administered 2016-04-18 – 2016-04-23 (×6): via INTRAVENOUS
  Filled 2016-04-18 (×13): qty 1000

## 2016-04-18 MED ORDER — POTASSIUM CHLORIDE 10 MEQ/100ML IV SOLN
10.0000 meq | INTRAVENOUS | Status: AC
  Administered 2016-04-18 (×3): 10 meq via INTRAVENOUS
  Filled 2016-04-18 (×5): qty 100

## 2016-04-18 MED ORDER — POTASSIUM CHLORIDE CRYS ER 20 MEQ PO TBCR
40.0000 meq | EXTENDED_RELEASE_TABLET | ORAL | Status: AC
  Administered 2016-04-18 (×2): 40 meq via ORAL
  Filled 2016-04-18 (×2): qty 2

## 2016-04-18 MED ORDER — POTASSIUM CHLORIDE 10 MEQ/100ML IV SOLN
10.0000 meq | INTRAVENOUS | Status: DC
Start: 2016-04-18 — End: 2016-04-18
  Administered 2016-04-18: 10 meq via INTRAVENOUS
  Filled 2016-04-18 (×3): qty 100

## 2016-04-18 NOTE — Care Management Important Message (Signed)
Important Message  Patient Details  Name: Bill [name] MRN: [id number] Date of Birth: 10/10/1940   Medicare Important Message Given:  Yes    Dorena BodoIris Antara Brecheisen 04/18/2016, 10:51 AM

## 2016-04-18 NOTE — Progress Notes (Signed)
Patient has a morning critical lab value K+ 2.4.  Informed on call via amion.  Will inform on-coming staff as well.

## 2016-04-18 NOTE — Progress Notes (Signed)
Pt refusing blood draw at this time-will let us get his blood in one hour. Will monitor.

## 2016-04-18 NOTE — Progress Notes (Signed)
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 and admissions for aspiration/2014 Hyperlipidemia B12 deficiency Urinary incontinence History of lower GI bleed-diverticular bleed vs hemorrhoids 12/2013  Admitted to [facility] 04/13/16 abd pain/discomfort + found on Ct abd to have Sig volvulus  GI consulted and volvulus reduced by flex Sig Gen surgery evaluating for need for surgery     Assessment/Plan:    Sigmoid volvulus -Patient with subacute onset of abdominal pain found to have volvulus on CT -GI did flexible sigmoidoscopy to reduce  -?may need surgical repair; gen surg input appreciated and await final discussions with rest of family coming into town.  They are aware a colostomy might be required  Surgery delayed due to hypokalemia. Gen. surgery following and plan for sigmoid colectomy with colostomy next week  Fever  Likely from UTI Patient came with a abnormal UA which showed positive nitrite.  ceftriaxone 1 g IV daily.  urine culture , blood culture 2.  H/o CVA -Severe debility, completely dependent for ADLs -Only takes ASA daily (will continue) -Was prescribed Lipitor at last hospitalization ([facility]), does not appear to be taking it -Unclear benefit from HTN/HLD control, wouldn't probably chenge long-term outcomes  Pulmonary nodule -Consider outpatient f/u  Hypokalemia  potassium is 2.2. Discussed with nephrology, will check renin, aldosterone, urine potassium, will continue with IV Potassium 10 meq q 1 hr x 6 runs.  Hypomagnesemia-  replace magnesium, will recheck magnesium in am.    DVT prophylaxis: Lovenox Code Status: Full code Family Communication: Discussed with patient's wife at  bedside Disposition Plan: Pending general surgery recommendations   Consultants:  Gastroenterology  Gen. Surgery   Procedures:  Sigmoid decompression  Antibiotics:  None    Subjective   Patient seen and examined. Potassium is 2.2 today after 4 runs of IV potassium. Magnesium is 1.7  Objective    Objective: Vitals:   04/17/16 0921 04/17/16 1707 04/17/16 2146 04/18/16 0937  BP: (!) 152/69 (!) 157/78 (!) 158/72 (!) 144/71  Pulse: 65 72 (!) 59 68  Resp: 17 17 16 16   Temp: 98.2 F (36.8 C) 98.6 F (37 C) 97.5 F (36.4 C) 98.9 F (37.2 C)  TempSrc: Axillary Axillary Oral Oral  SpO2: 100% 97% 91% 100%  Weight:   61.5 kg (135 lb 9.3 oz)   Height:   6' (1.829 m)     Intake/Output Summary (Last 24 hours) at 04/18/16 1341 Last data filed at 04/18/16 1330  Gross per 24 hour  Intake             1220 ml  Output             2575 ml  Net            -1355 ml   Filed Weights   04/14/16 2057 04/15/16 2139 04/17/16 2146  Weight: 59.8 kg (131 lb 14.4 oz) 61.3 kg (135 lb 3.2 oz) 61.5 kg (135 lb 9.3 oz)    Examination:  General exam: Appears calm and comfortable  Respiratory system: Clear to auscultation. Respiratory effort normal. Cardiovascular system: S1 & S2 heard, RRR. No JVD, murmurs, rubs, gallops or clicks. No pedal edema. Gastrointestinal system: Abdomen is nondistended, soft and nontender. No organomegaly or masses felt. Normal bowel  sounds heard. Central nervous system: Nonverbal. No focal neurological deficits. Extremities: Symmetric 5 x 5 power. Skin: No rashes, lesions or ulcers Psychiatry: Judgement and insight appear normal. Mood & affect appropriate.    Data Reviewed: I have personally reviewed following labs and imaging studies Basic Metabolic Panel:  Recent Labs Lab 04/13/16 1725 04/14/16 0232 04/17/16 0418 04/18/16 0517 04/18/16 1155  NA 141 141 138 137  --   K 3.0* 3.4* 2.4* 2.4* 2.2*  CL 105 108 102 98*  --   CO2 25 26 24 27   --    GLUCOSE 108* 92 91 119*  --   BUN 9 8 <5* <5*  --   CREATININE 0.71 0.76 0.71 0.70  --   CALCIUM 9.0 7.9* 7.4* 7.2*  --   MG  --   --   --  1.6*  --    Liver Function Tests:  Recent Labs Lab 04/13/16 1725  AST 22  ALT 17  ALKPHOS 77  BILITOT 1.2  PROT 7.1  ALBUMIN 4.2    Recent Labs Lab 04/13/16 1725  LIPASE 18   No results for input(s): AMMONIA in the last 168 hours. CBC:  Recent Labs Lab 04/13/16 1725 04/14/16 0232  WBC 11.8* 8.4  HGB 15.4 12.7*  HCT 47.0 39.8  MCV 95.1 95.7  PLT 121* 98*   Cardiac Enzymes: No results for input(s): CKTOTAL, CKMB, CKMBINDEX, TROPONINI in the last 168 hours. BNP (last 3 results) No results for input(s): BNP in the last 8760 hours.  ProBNP (last 3 results) No results for input(s): PROBNP in the last 8760 hours.  CBG:  Recent Labs Lab 04/16/16 2202  GLUCAP 126*    Recent Results (from the past 240 hour(s))  C difficile quick scan w PCR reflex     Status: None   Collection Time: 04/15/16 10:35 PM  Result Value Ref Range Status   C Diff antigen NEGATIVE NEGATIVE Final   C Diff toxin NEGATIVE NEGATIVE Final   C Diff interpretation No C. difficile detected.  Final  Culture, blood (Routine X 2) w Reflex to ID Panel     Status: None (Preliminary result)   Collection Time: 04/16/16  4:22 PM  Result Value Ref Range Status   Specimen Description BLOOD LEFT ARM  Final   Special Requests BOTTLES DRAWN AEROBIC ONLY 4CC  Final   Culture NO GROWTH 2 DAYS  Final   Report Status PENDING  Incomplete  Culture, blood (Routine X 2) w Reflex to ID Panel     Status: None (Preliminary result)   Collection Time: 04/16/16  4:30 PM  Result Value Ref Range Status   Specimen Description BLOOD LEFT ARM  Final   Special Requests   Final    BOTTLES DRAWN AEROBIC AND ANAEROBIC BLUE 8CC RED 2CC   Culture NO GROWTH 2 DAYS  Final   Report Status PENDING  Incomplete     Studies: No results found.  Scheduled Meds: . aspirin  81 mg Oral  Daily  . cefTRIAXone (ROCEPHIN)  IV  1 g Intravenous Q24H  . enoxaparin (LOVENOX) injection  40 mg Subcutaneous Daily  . feeding supplement  1 Container Oral TID BM  . potassium chloride  10 mEq Intravenous Q1 Hr x 6  . sodium chloride flush  3 mL Intravenous Q12H   Continuous Infusions: . dextrose 5 % and 0.45% NaCl 1,000 mL with potassium chloride 20 mEq infusion 75 mL/hr at 04/18/16 1315       Time  spent: 25 min    [facility]  [facility] Pager [phone]. If 7PM-7AM, please contact night-coverage at [email or web address], Office  [phone]  password [email or web address] 04/18/2016, 1:41 PM  LOS: 5 days

## 2016-04-18 NOTE — Progress Notes (Signed)
[facility] Progress Note  4 Days Post-Op  Subjective: Family not in the room during my exam this morning. Patient resting comfortably. NAE overnight. No complaints. K+ remains at 2.4 this AM after 5 runs of KCl, Mg 1.6.   Objective: Vital signs in last 24 hours: Temp:  [97.5 F (36.4 C)-98.6 F (37 C)] 97.5 F (36.4 C) (09/07 2146) Pulse Rate:  [59-72] 59 (09/07 2146) Resp:  [16-17] 16 (09/07 2146) BP: (152-158)/(69-78) 158/72 (09/07 2146) SpO2:  [91 %-100 %] 91 % (09/07 2146) Weight:  [61.5 kg (135 lb 9.3 oz)] 61.5 kg (135 lb 9.3 oz) (09/07 2146) Last BM Date: 04/17/16  Intake/Output from previous day: 09/07 0701 - 09/08 0700 In: 1400 [P.O.:300; I.V.:1100] Out: 1650 [Urine:1650] Intake/Output this shift: Total I/O In: -  Out: 550 [Urine:550]  PE: Gen:  Alert, NAD, pleasant, aphasic Card:  RRR Pulm:  CTAB Abd: Soft, NT/ND, +BS Neuro: quadriplegic   Lab Results:  No results for input(s): WBC, HGB, HCT, PLT in the last 72 hours. BMET  Recent Labs  04/17/16 0418 04/18/16 0517  NA 138 137  K 2.4* 2.4*  CL 102 98*  CO2 24 27  GLUCOSE 91 119*  BUN <5* <5*  CREATININE 0.71 0.70  CALCIUM 7.4* 7.2*   PT/INR No results for input(s): LABPROT, INR in the last 72 hours. CMP     Component Value Date/Time   NA 137 04/18/2016 0517   NA 141 12/28/2013 0555   K 2.4 (LL) 04/18/2016 0517   K 3.7 12/28/2013 0555   CL 98 (L) 04/18/2016 0517   CL 107 12/28/2013 0555   CO2 27 04/18/2016 0517   CO2 29 12/28/2013 0555   GLUCOSE 119 (H) 04/18/2016 0517   GLUCOSE 75 12/28/2013 0555   BUN <5 (L) 04/18/2016 0517   BUN 7 12/28/2013 0555   CREATININE 0.70 04/18/2016 0517   CREATININE 0.76 12/28/2013 0555   CALCIUM 7.2 (L) 04/18/2016 0517   CALCIUM 8.1 (L) 12/28/2013 0555   PROT 7.1 04/13/2016 1725   PROT 7.4 12/27/2013 1619   ALBUMIN 4.2 04/13/2016 1725   ALBUMIN 3.7 12/27/2013 1619   AST 22 04/13/2016 1725   AST 15 12/27/2013 1619   ALT 17 04/13/2016  1725   ALT 15 12/27/2013 1619   ALKPHOS 77 04/13/2016 1725   ALKPHOS 106 12/27/2013 1619   BILITOT 1.2 04/13/2016 1725   BILITOT 0.3 12/27/2013 1619   GFRNONAA >60 04/18/2016 0517   GFRNONAA >60 12/28/2013 0555   GFRAA >60 04/18/2016 0517   GFRAA >60 12/28/2013 0555   Lipase     Component Value Date/Time   LIPASE 18 04/13/2016 1725   Studies/Results: No results found.  Anti-infectives: Anti-infectives    Start     Dose/Rate Route Frequency Ordered Stop   04/16/16 1600  cefTRIAXone (ROCEPHIN) 1 g in dextrose 5 % 50 mL IVPB     1 g 100 mL/hr over 30 Minutes Intravenous Every 24 hours 04/16/16 1539         Assessment/Plan Spoke with Dr. Sharl MaLama over the phone - he is placing orders to correct K+, Mg.  We will plan to proceed with sigmoid colectomy when his K+ has been adequately repleted. This afternoon vs tomorrow.  Diet can be ordered after lunch time if K+ still < 3.0.  We will come back to speak with family before lunchtime.    LOS: 5 days    Adam PhenixElizabeth S Mel Langan , [facility] 04/18/2016, 7:58 AM  Pager: [phone] Consults: [phone] Mon-Fri 7:00 am-4:30 pm Sat-Sun 7:00 am-11:30 am

## 2016-04-19 LAB — MAGNESIUM: Magnesium: 1.7 mg/dL (ref 1.7–2.4)

## 2016-04-19 LAB — BASIC METABOLIC PANEL
Anion gap: 8 (ref 5–15)
CALCIUM: 7.6 mg/dL — AB (ref 8.9–10.3)
CO2: 28 mmol/L (ref 22–32)
CREATININE: 0.58 mg/dL — AB (ref 0.61–1.24)
Chloride: 103 mmol/L (ref 101–111)
GFR calc Af Amer: >60 mL/min (ref >60)
GLUCOSE: 103 mg/dL — AB (ref 65–99)
Potassium: 2.9 mmol/L — ABNORMAL LOW (ref 3.5–5.1)
Sodium: 139 mmol/L (ref 135–145)

## 2016-04-19 MED ORDER — POTASSIUM CHLORIDE CRYS ER 20 MEQ PO TBCR
40.0000 meq | EXTENDED_RELEASE_TABLET | ORAL | Status: AC
  Administered 2016-04-19 (×2): 40 meq via ORAL
  Filled 2016-04-19 (×2): qty 2

## 2016-04-19 MED ORDER — POTASSIUM CHLORIDE 10 MEQ/100ML IV SOLN
10.0000 meq | INTRAVENOUS | Status: AC
  Administered 2016-04-19 (×3): 10 meq via INTRAVENOUS
  Filled 2016-04-19 (×3): qty 100

## 2016-04-19 NOTE — Progress Notes (Signed)
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 and admissions for aspiration/2014 Hyperlipidemia B12 deficiency Urinary incontinence History of lower GI bleed-diverticular bleed vs hemorrhoids 12/2013  [facility] abd pain/discomfort + found on Ct abd to have Sig volvulus  GI consulted and volvulus reduced by flex Sig Gen surgery evaluating for need for surgery     Assessment/Plan:    Sigmoid volvulus -Patient with subacute onset of abdominal pain found to have volvulus on CT -GI did flexible sigmoidoscopy to reduce  -?may need surgical repair; gen surg input appreciated and await final discussions with rest of family coming into town.  They are aware a colostomy might be required  Surgery delayed due to hypokalemia. Gen. surgery following and plan for sigmoid colectomy with colostomy next week  Fever  Likely from UTI Patient came with a abnormal UA which showed positive nitrite.  ceftriaxone 1 g IV daily.  urine culture , blood culture 2.  H/o CVA -Severe debility, completely dependent for ADLs -Only takes ASA daily (will continue) -Was prescribed Lipitor at last hospitalization ([facility] or [facility]), does not appear to be taking it -Unclear benefit from HTN/HLD control, wouldn't probably chenge long-term outcomes  Pulmonary nodule -Consider outpatient f/u  Hypokalemia  potassium is 2.4. Discussed with nephrology, will check renin, aldosterone, urine potassium, will continue with IV Potassium 10 meq q 1 hr x 3 runs.We'll start K-Dur 40 mg by mouth twice a day 2 doses  Hypomagnesemia-  replaced magnesium,today magnesium is 1.7    DVT prophylaxis: Lovenox Code Status: Full code Family  Communication: Discussed with patient's wife at bedside Disposition Plan: Pending general surgery recommendations   Consultants:  Gastroenterology  Gen. Surgery   Procedures:  Sigmoid decompression  Antibiotics:  None    Subjective   Patient seen and examined. Potassium is 2.9 today after 4 runs of IV potassium.  Objective    Objective: Vitals:   04/18/16 1705 04/18/16 2019 04/19/16 0514 04/19/16 1014  BP: 140/84 (!) 147/76 (!) 149/78 (!) 144/77  Pulse: 82 82 77 73  Resp: 17 16 18 18   Temp: 99.5 F (37.5 C) (!) 100.8 F (38.2 C) 99.1 F (37.3 C) 98.5 F (36.9 C)  TempSrc: Oral Axillary Axillary Oral  SpO2: 98% 95% 95% 97%  Weight:  62.8 kg (138 lb 7.2 oz)    Height:        Intake/Output Summary (Last 24 hours) at 04/19/16 1303 Last data filed at 04/19/16 0600  Gross per 24 hour  Intake          1741.25 ml  Output             2025 ml  Net          -283.75 ml   Filed Weights   04/15/16 2139 04/17/16 2146 04/18/16 2019  Weight: 61.3 kg (135 lb 3.2 oz) 61.5 kg (135 lb 9.3 oz) 62.8 kg (138 lb 7.2 oz)    Examination:  General exam: Appears calm and comfortable  Respiratory system: Clear to auscultation. Respiratory effort normal. Cardiovascular system: S1 & S2 heard, RRR. No JVD, murmurs, rubs, gallops or clicks. No pedal edema. Gastrointestinal system: Abdomen is nondistended, soft and nontender. No organomegaly or masses felt. Normal bowel sounds heard.  Central nervous system: Nonverbal. No focal neurological deficits. Extremities: Symmetric 5 x 5 power. Skin: No rashes, lesions or ulcers Psychiatry: Judgement and insight appear normal. Mood & affect appropriate.    Data Reviewed: I have personally reviewed following labs and imaging studies Basic Metabolic Panel:  Recent Labs Lab 04/13/16 1725 04/14/16 0232 04/17/16 0418 04/18/16 0517 04/18/16 1155 04/19/16 0815  NA 141 141 138 137  --  139  K 3.0* 3.4* 2.4* 2.4* 2.2* 2.9*  CL 105 108 102  98*  --  103  CO2 25 26 24 27   --  28  GLUCOSE 108* 92 91 119*  --  103*  BUN 9 8 <5* <5*  --  <5*  CREATININE 0.71 0.76 0.71 0.70  --  0.58*  CALCIUM 9.0 7.9* 7.4* 7.2*  --  7.6*  MG  --   --   --  1.6*  --  1.7   Liver Function Tests:  Recent Labs Lab 04/13/16 1725  AST 22  ALT 17  ALKPHOS 77  BILITOT 1.2  PROT 7.1  ALBUMIN 4.2    Recent Labs Lab 04/13/16 1725  LIPASE 18   No results for input(s): AMMONIA in the last 168 hours. CBC:  Recent Labs Lab 04/13/16 1725 04/14/16 0232  WBC 11.8* 8.4  HGB 15.4 12.7*  HCT 47.0 39.8  MCV 95.1 95.7  PLT 121* 98*   Cardiac Enzymes: No results for input(s): CKTOTAL, CKMB, CKMBINDEX, TROPONINI in the last 168 hours. BNP (last 3 results) No results for input(s): BNP in the last 8760 hours.  ProBNP (last 3 results) No results for input(s): PROBNP in the last 8760 hours.  CBG:  Recent Labs Lab 04/16/16 2202  GLUCAP 126*    Recent Results (from the past 240 hour(s))  C difficile quick scan w PCR reflex     Status: None   Collection Time: 04/15/16 10:35 PM  Result Value Ref Range Status   C Diff antigen NEGATIVE NEGATIVE Final   C Diff toxin NEGATIVE NEGATIVE Final   C Diff interpretation No C. difficile detected.  Final  Culture, blood (Routine X 2) w Reflex to ID Panel     Status: None (Preliminary result)   Collection Time: 04/16/16  4:22 PM  Result Value Ref Range Status   Specimen Description BLOOD LEFT ARM  Final   Special Requests BOTTLES DRAWN AEROBIC ONLY 4CC  Final   Culture NO GROWTH 3 DAYS  Final   Report Status PENDING  Incomplete  Culture, blood (Routine X 2) w Reflex to ID Panel     Status: None (Preliminary result)   Collection Time: 04/16/16  4:30 PM  Result Value Ref Range Status   Specimen Description BLOOD LEFT ARM  Final   Special Requests   Final    BOTTLES DRAWN AEROBIC AND ANAEROBIC BLUE 8CC RED 2CC   Culture NO GROWTH 3 DAYS  Final   Report Status PENDING  Incomplete      Studies: No results found.  Scheduled Meds: . aspirin  81 mg Oral Daily  . cefTRIAXone (ROCEPHIN)  IV  1 g Intravenous Q24H  . enoxaparin (LOVENOX) injection  40 mg Subcutaneous Daily  . feeding supplement  1 Container Oral TID BM  . potassium chloride  10 mEq Intravenous Q1 Hr x 3  . potassium chloride  40 mEq Oral Q4H  . sodium chloride flush  3 mL Intravenous Q12H   Continuous Infusions: . dextrose 5 % and 0.45% NaCl 1,000 mL  with potassium chloride 20 mEq infusion 75 mL/hr at 04/19/16 0500       Time spent: 25 min    [facility]  [facility] Pager [phone]. If 7PM-7AM, please contact night-coverage at [email or web address], Office  [phone]  password [email or web address] 04/19/2016, 1:03 PM  LOS: 6 days

## 2016-04-19 NOTE — Progress Notes (Signed)
Patient had moderate sized clear mucus type discharge from anus.  This is the second stool like this during the night.  No signs of distress.  Sample collected and left at bedside for oncoming physician to assess.  [facility] made aware.  No new orders received.  Will continue to monitor patient.  [facility] MinorKimberly Evelia Waskey RN-BC, WTA.

## 2016-04-20 LAB — BASIC METABOLIC PANEL
Anion gap: 6 (ref 5–15)
CHLORIDE: 106 mmol/L (ref 101–111)
CO2: 25 mmol/L (ref 22–32)
CREATININE: 0.56 mg/dL — AB (ref 0.61–1.24)
Calcium: 8 mg/dL — ABNORMAL LOW (ref 8.9–10.3)
GFR calc Af Amer: >60 mL/min (ref >60)
GFR calc non Af Amer: >60 mL/min (ref >60)
GLUCOSE: 96 mg/dL (ref 65–99)
Potassium: 4 mmol/L (ref 3.5–5.1)
SODIUM: 137 mmol/L (ref 135–145)

## 2016-04-20 NOTE — Progress Notes (Signed)
6 Days Post-Op  Subjective: Pt with no abd pain this AM  Objective: Vital signs in last 24 hours: Temp:  [98.4 F (36.9 C)-99.3 F (37.4 C)] 98.4 F (36.9 C) (09/10 0801) Pulse Rate:  [70-73] 73 (09/10 0801) Resp:  [16-18] 18 (09/10 0801) BP: (152-161)/(68-78) 153/71 (09/10 0801) SpO2:  [96 %-98 %] 96 % (09/10 0801) Weight:  [63.4 kg (139 lb 12.4 oz)] 63.4 kg (139 lb 12.4 oz) (09/09 2141) Last BM Date: 04/19/16  Intake/Output from previous day: 09/09 0701 - 09/10 0700 In: 2330 [P.O.:480; I.V.:1800; IV Piggyback:50] Out: 2120 [Urine:2120] Intake/Output this shift: Total I/O In: -  Out: 750 [Urine:750]  General appearance: alert and cooperative GI: soft, non-tender; bowel sounds normal; no masses,  no organomegaly  Lab Results:  No results for input(s): WBC, HGB, HCT, PLT in the last 72 hours. BMET  Recent Labs  04/19/16 0815 04/20/16 0506  NA 139 137  K 2.9* 4.0  CL 103 106  CO2 28 25  GLUCOSE 103* 96  BUN <5* <5*  CREATININE 0.58* 0.56*  CALCIUM 7.6* 8.0*   PT/INR No results for input(s): LABPROT, INR in the last 72 hours. ABG No results for input(s): PHART, HCO3 in the last 72 hours.  Invalid input(s): PCO2, PO2  Studies/Results: No results found.  Anti-infectives: Anti-infectives    Start     Dose/Rate Route Frequency Ordered Stop   04/16/16 1600  cefTRIAXone (ROCEPHIN) 1 g in dextrose 5 % 50 mL IVPB     1 g 100 mL/hr over 30 Minutes Intravenous Every 24 hours 04/16/16 1539        Assessment/Plan: 75 y/o M with sigmoid volvulus -plan for gentle prep and surgery over the 1-2d. Will let Dr. Johna SheriffHoxworth discuss surgery in detail with pt and his wife.   LOS: 7 days    Marigene Ehlersamirez Jr., [facility] 04/20/2016

## 2016-04-20 NOTE — Progress Notes (Signed)
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   Surgery delayed due to hypokalemia. Gen. surgery following and plan for sigmoid colectomy with colostomy next week  Fever  Likely from UTI Patient came with a abnormal UA which showed positive nitrite.  ceftriaxone 1 g IV daily.  urine culture , blood culture 2.  H/o CVA -Severe debility, completely dependent for ADLs -Only takes ASA daily (will continue) -Was prescribed Lipitor at last hospitalization ([facility]), does not appear to be taking it -Unclear benefit from HTN/HLD control, wouldn't probably chenge long-term outcomes  Pulmonary nodule -Consider outpatient f/u  Hypokalemia Resolved, today potassium is 4.0 Will repeat BMP in am.   Hypomagnesemia-  replaced magnesium, repeat magnesium is 1.7    DVT prophylaxis: Lovenox Code Status: Full code Family Communication: Discussed with patient's wife at bedside Disposition Plan: Pending general surgery recommendations   Consultants:  Gastroenterology  Gen. Surgery   Procedures:  Sigmoid decompression  Antibiotics:  None     Subjective   Patient seen and examined. Potassium is 4.0 today.  Objective    Objective: Vitals:   04/19/16 1700 04/19/16 2141 04/20/16 0455 04/20/16 0801  BP: (!) 161/73 (!) 152/68 (!) 154/78 (!) 153/71  Pulse: 71 70 71 73  Resp: 18 16 17 18   Temp: 99.3 F (37.4 C) 99 F (37.2 C) 98.5 F (36.9 C) 98.4 F (36.9 C)  TempSrc: Oral Oral Oral Oral  SpO2: 98% 98% 97% 96%  Weight:  63.4 kg (139 lb 12.4 oz)    Height:        Intake/Output Summary (Last 24 hours) at 04/20/16 0937 Last data filed at 04/20/16 8119  Gross per 24 hour  Intake             2210 ml  Output             2870 ml  Net             -660 ml   Filed Weights   04/17/16 2146 04/18/16 2019 04/19/16 2141  Weight: 61.5 kg (135 lb 9.3 oz) 62.8 kg (138 lb 7.2 oz) 63.4 kg (139 lb 12.4 oz)    Examination:  General exam: Appears calm and comfortable  Respiratory system: Clear to auscultation. Respiratory effort normal. Cardiovascular system: S1 & S2 heard, RRR. No JVD, murmurs, rubs, gallops or clicks. No pedal edema. Gastrointestinal system: Abdomen is nondistended, soft and nontender. No organomegaly or masses felt. Normal bowel sounds heard. Central nervous system: Nonverbal. No focal neurological deficits. Extremities: Symmetric 5 x 5 power. Skin: No rashes, lesions or ulcers Psychiatry: Judgement and insight appear normal. Mood & affect appropriate.    Data Reviewed: I have personally reviewed following  labs and imaging studies Basic Metabolic Panel:  Recent Labs Lab 04/14/16 0232 04/17/16 0418 04/18/16 0517 04/18/16 1155 04/19/16 0815 04/20/16 0506  NA 141 138 137  --  139 137  K 3.4* 2.4* 2.4* 2.2* 2.9* 4.0  CL 108 102 98*  --  103 106  CO2 26 24 27   --  28 25  GLUCOSE 92 91 119*  --  103* 96  BUN 8 <5* <5*  --  <5* <5*  CREATININE 0.76 0.71 0.70  --  0.58* 0.56*  CALCIUM 7.9* 7.4* 7.2*  --  7.6* 8.0*  MG  --   --  1.6*  --  1.7  --    Liver Function Tests:  Recent Labs Lab  04/13/16 1725  AST 22  ALT 17  ALKPHOS 77  BILITOT 1.2  PROT 7.1  ALBUMIN 4.2    Recent Labs Lab 04/13/16 1725  LIPASE 18   No results for input(s): AMMONIA in the last 168 hours. CBC:  Recent Labs Lab 04/13/16 1725 04/14/16 0232  WBC 11.8* 8.4  HGB 15.4 12.7*  HCT 47.0 39.8  MCV 95.1 95.7  PLT 121* 98*    CBG:  Recent Labs Lab 04/16/16 2202  GLUCAP 126*    Recent Results (from the past 240 hour(s))  C difficile quick scan w PCR reflex     Status: None   Collection Time: 04/15/16 10:35 PM  Result Value Ref Range Status   C Diff antigen NEGATIVE NEGATIVE Final   C Diff toxin NEGATIVE NEGATIVE Final   C Diff interpretation No C. difficile detected.  Final  Culture, blood (Routine X 2) w Reflex to ID Panel     Status: None (Preliminary result)   Collection Time: 04/16/16  4:22 PM  Result Value Ref Range Status   Specimen Description BLOOD LEFT ARM  Final   Special Requests BOTTLES DRAWN AEROBIC ONLY 4CC  Final   Culture NO GROWTH 3 DAYS  Final   Report Status PENDING  Incomplete  Culture, blood (Routine X 2) w Reflex to ID Panel     Status: None (Preliminary result)   Collection Time: 04/16/16  4:30 PM  Result Value Ref Range Status   Specimen Description BLOOD LEFT ARM  Final   Special Requests   Final    BOTTLES DRAWN AEROBIC AND ANAEROBIC BLUE 8CC RED 2CC   Culture NO GROWTH 3 DAYS  Final   Report Status PENDING  Incomplete  Culture, Urine     Status: Abnormal (Preliminary result)   Collection Time: 04/19/16  8:49 AM  Result Value Ref Range Status   Specimen Description URINE, CLEAN CATCH  Final   Special Requests rocephin Immunocompromised  Final   Culture (A)  Final    >=100,000 COLONIES/mL GRAM NEGATIVE RODS CULTURE REINCUBATED FOR BETTER GROWTH    Report Status PENDING  Incomplete     Studies: No results found.  Scheduled Meds: . aspirin  81 mg Oral Daily  . cefTRIAXone (ROCEPHIN)  IV  1 g Intravenous Q24H  . enoxaparin (LOVENOX)  injection  40 mg Subcutaneous Daily  . feeding supplement  1 Container Oral TID BM  . sodium chloride flush  3 mL Intravenous Q12H   Continuous Infusions: . dextrose 5 % and 0.45% NaCl 1,000 mL with potassium chloride 20 mEq infusion 75 mL/hr at 04/20/16 0703       Time spent: 25 min    [facility]  [facility] Pager [phone]. If 7PM-7AM, please contact night-coverage  at [email or web address], Office  [phone]  password [email or web address] 04/20/2016, 9:37 AM  LOS: 7 days

## 2016-04-21 LAB — BASIC METABOLIC PANEL
ANION GAP: 8 (ref 5–15)
BUN: <5 mg/dL — ABNORMAL LOW (ref 6–20)
CALCIUM: 8.1 mg/dL — AB (ref 8.9–10.3)
CO2: 28 mmol/L (ref 22–32)
CREATININE: 0.57 mg/dL — AB (ref 0.61–1.24)
Chloride: 103 mmol/L (ref 101–111)
GLUCOSE: 119 mg/dL — AB (ref 65–99)
Potassium: 3.3 mmol/L — ABNORMAL LOW (ref 3.5–5.1)
Sodium: 139 mmol/L (ref 135–145)

## 2016-04-21 LAB — CULTURE, BLOOD (ROUTINE X 2)
CULTURE: NO GROWTH
Culture: NO GROWTH

## 2016-04-21 MED ORDER — POTASSIUM CHLORIDE CRYS ER 20 MEQ PO TBCR
40.0000 meq | EXTENDED_RELEASE_TABLET | Freq: Once | ORAL | Status: AC
  Administered 2016-04-21: 40 meq via ORAL
  Filled 2016-04-21: qty 2

## 2016-04-21 MED ORDER — POTASSIUM CHLORIDE 10 MEQ/100ML IV SOLN: 10.0000 meq | Freq: Once | INTRAVENOUS | Status: DC

## 2016-04-21 MED ORDER — POTASSIUM CHLORIDE 20 MEQ/15ML (10%) PO SOLN: 40.0000 meq | Freq: Once | ORAL | Status: DC

## 2016-04-21 MED ORDER — POTASSIUM CHLORIDE 20 MEQ/15ML (10%) PO SOLN
40.0000 meq | Freq: Once | ORAL | Status: AC
  Administered 2016-04-21: 40 meq via ORAL
  Filled 2016-04-21: qty 30

## 2016-04-21 NOTE — Progress Notes (Signed)
RN and IV team both attempted to restart IV. Patient and wife refusing to have IV restarted at this time.  Dr. Sharl MaLama notified.

## 2016-04-21 NOTE — Progress Notes (Signed)
Patient ID: Bill [name], male   DOB: 04/10/1941, 75 y.o.   MRN: [id number]  [facility]  7 Days Post-Op  Subjective: Denies any abdominal pain. Had BM this morning.  Objective: Vital signs in last 24 hours: Temp:  [97.6 F (36.4 C)-98.5 F (36.9 C)] 97.6 F (36.4 C) (09/11 0504) Pulse Rate:  [68-73] 70 (09/11 0504) Resp:  [16-70] 70 (09/11 0504) BP: (146-174)/(68-73) 159/73 (09/11 0504) SpO2:  [97 %-98 %] 97 % (09/11 0504) Weight:  [133 lb 13.1 oz (60.7 kg)] 133 lb 13.1 oz (60.7 kg) (09/10 2124) Last BM Date: 04/19/16  Intake/Output from previous day: 09/10 0701 - 09/11 0700 In: 2302.5 [P.O.:490; I.V.:1762.5; IV Piggyback:50] Out: 1850 [Urine:1850] Intake/Output this shift: Total I/O In: -  Out: 500 [Urine:500]  PE: Gen:  Alert, NAD, lying comfortably in bed Card:  RRR Pulm:  CTAB, effort nonlabored Abd: Soft, NT Neuro: aphasic, quadriplegic  Lab Results:  No results for input(s): WBC, HGB, HCT, PLT in the last 72 hours. BMET  Recent Labs  04/19/16 0815 04/20/16 0506  NA 139 137  K 2.9* 4.0  CL 103 106  CO2 28 25  GLUCOSE 103* 96  BUN <5* <5*  CREATININE 0.58* 0.56*  CALCIUM 7.6* 8.0*   PT/INR No results for input(s): LABPROT, INR in the last 72 hours. CMP     Component Value Date/Time   NA 137 04/20/2016 0506   NA 141 12/28/2013 0555   K 4.0 04/20/2016 0506   K 3.7 12/28/2013 0555   CL 106 04/20/2016 0506   CL 107 12/28/2013 0555   CO2 25 04/20/2016 0506   CO2 29 12/28/2013 0555   GLUCOSE 96 04/20/2016 0506   GLUCOSE 75 12/28/2013 0555   BUN <5 (L) 04/20/2016 0506   BUN 7 12/28/2013 0555   CREATININE 0.56 (L) 04/20/2016 0506   CREATININE 0.76 12/28/2013 0555   CALCIUM 8.0 (L) 04/20/2016 0506   CALCIUM 8.1 (L) 12/28/2013 0555   PROT 7.1 04/13/2016 1725   PROT 7.4 12/27/2013 1619   ALBUMIN 4.2 04/13/2016 1725   ALBUMIN 3.7 12/27/2013 1619   AST 22 04/13/2016 1725   AST 15 12/27/2013 1619   ALT 17 04/13/2016  1725   ALT 15 12/27/2013 1619   ALKPHOS 77 04/13/2016 1725   ALKPHOS 106 12/27/2013 1619   BILITOT 1.2 04/13/2016 1725   BILITOT 0.3 12/27/2013 1619   GFRNONAA >60 04/20/2016 0506   GFRNONAA >60 12/28/2013 0555   GFRAA >60 04/20/2016 0506   GFRAA >60 12/28/2013 0555   Lipase     Component Value Date/Time   LIPASE 18 04/13/2016 1725       Studies/Results: No results found.  Anti-infectives: Anti-infectives    Start     Dose/Rate Route Frequency Ordered Stop   04/16/16 1600  cefTRIAXone (ROCEPHIN) 1 g in dextrose 5 % 50 mL IVPB     1 g 100 mL/hr over 30 Minutes Intravenous Every 24 hours 04/16/16 1539         Assessment/Plan Sigmoid volvulus -s/p reduction via flexible sigmoidoscopy 04/14/16 -no abdominal pain today -will discuss further once family arrives H/o CVA, quadriplegia Mild hypokalemia - resolved, 4.0 today  FEN - IVF, clear liquids VTE - lovenox ID - none  Plan - will discuss surgical vs conservative treatment later today with wife. She just arrived.   LOS: 8 days    Edson SnowballBROOKE A MILLER , [facility] 04/21/2016, 10:30 AM Pager: [phone] Consults: [phone] Mon-Fri  7:00 am-4:30 pm Sat-Sun 7:00 am-11:30 am

## 2016-04-21 NOTE — Progress Notes (Signed)
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   Surgery delayed due to hypokalemia. Gen. surgery following and plan for sigmoid colectomy with colostomy next week  Fever  Resolved Likely from UTI Patient came with a abnormal UA which showed positive nitrite.  ceftriaxone 1 g IV daily , blood culture 2 is negative so far. Urine culture growing gram-negative rods, final result is pending  H/o CVA -Severe debility, completely dependent for ADLs -Only takes ASA daily (will continue) -Was prescribed Lipitor at last hospitalization ([facility]), does not appear to be taking it -Unclear benefit from HTN/HLD control, wouldn't probably chenge long-term outcomes  Pulmonary nodule -Consider outpatient f/u  Hypokalemia  today potassium is 3.3 Will give 1 dose of 10 mEq IV KCl Will repeat BMP in am.   Hypomagnesemia-  replaced magnesium, repeat magnesium is 1.7  Magnesium in a.m.     DVT prophylaxis: Lovenox Code Status: Full code Family Communication: Discussed with patient's wife at bedside Disposition Plan: Pending general surgery  recommendations   Consultants:  Gastroenterology  Gen. Surgery   Procedures:  Sigmoid decompression  Antibiotics:  None    Subjective   Patient seen and examined. Potassium is 3.3 today.  Objective    Objective: Vitals:   04/20/16 1658 04/20/16 2124 04/21/16 0504 04/21/16 1200  BP: (!) 146/73 (!) 174/68 (!) 159/73 (!) 143/78  Pulse: 68 73 70 76  Resp: 17 16 (!) 70 18  Temp: 98.5 F (36.9 C) 97.6 F (36.4 C) 97.6 F (36.4 C) 97.5 F (36.4 C)  TempSrc: Oral Oral Oral Oral  SpO2: 98% 98% 97% 96%  Weight:  60.7 kg (133 lb 13.1 oz)    Height:        Intake/Output Summary (Last 24 hours) at 04/21/16 1323 Last data filed at 04/21/16 1200  Gross per 24 hour  Intake             2650 ml  Output             1720 ml  Net              930 ml   Filed Weights   04/18/16 2019 04/19/16 2141 04/20/16 2124  Weight: 62.8 kg (138 lb 7.2 oz) 63.4 kg (139 lb 12.4 oz) 60.7 kg (133 lb 13.1 oz)    Examination:  General exam: Appears calm and comfortable  Respiratory system: Clear to auscultation. Respiratory effort normal. Cardiovascular system: S1 & S2 heard, RRR. No JVD, murmurs, rubs, gallops or clicks. No pedal edema. Gastrointestinal system: Abdomen is nondistended, soft and nontender. No organomegaly or masses felt. Normal bowel sounds heard. Central nervous system: Nonverbal. No focal neurological deficits. Extremities: Symmetric 5 x 5  power. Skin: No rashes, lesions or ulcers Psychiatry: Judgement and insight appear normal. Mood & affect appropriate.    Data Reviewed: I have personally reviewed following labs and imaging studies Basic Metabolic Panel:  Recent Labs Lab 04/17/16 0418 04/18/16 0517 04/18/16 1155 04/19/16 0815 04/20/16 0506 04/21/16 1146  NA 138 137  --  139 137 139  K 2.4* 2.4* 2.2* 2.9* 4.0 3.3*  CL 102 98*  --  103 106 103  CO2 24 27  --  28 25 28   GLUCOSE 91 119*  --  103* 96 119*  BUN <5* <5*  --  <5* <5* <5*  CREATININE 0.71 0.70  --   0.58* 0.56* 0.57*  CALCIUM 7.4* 7.2*  --  7.6* 8.0* 8.1*  MG  --  1.6*  --  1.7  --   --     CBG:  Recent Labs Lab 04/16/16 2202  GLUCAP 126*    Recent Results (from the past 240 hour(s))  C difficile quick scan w PCR reflex     Status: None   Collection Time: 04/15/16 10:35 PM  Result Value Ref Range Status   C Diff antigen NEGATIVE NEGATIVE Final   C Diff toxin NEGATIVE NEGATIVE Final   C Diff interpretation No C. difficile detected.  Final  Culture, blood (Routine X 2) w Reflex to ID Panel     Status: None (Preliminary result)   Collection Time: 04/16/16  4:22 PM  Result Value Ref Range Status   Specimen Description BLOOD LEFT ARM  Final   Special Requests BOTTLES DRAWN AEROBIC ONLY 4CC  Final   Culture NO GROWTH 4 DAYS  Final   Report Status PENDING  Incomplete  Culture, blood (Routine X 2) w Reflex to ID Panel     Status: None (Preliminary result)   Collection Time: 04/16/16  4:30 PM  Result Value Ref Range Status   Specimen Description BLOOD LEFT ARM  Final   Special Requests   Final    BOTTLES DRAWN AEROBIC AND ANAEROBIC BLUE 8CC RED 2CC   Culture NO GROWTH 4 DAYS  Final   Report Status PENDING  Incomplete  Culture, Urine     Status: Abnormal (Preliminary result)   Collection Time: 04/19/16  8:49 AM  Result Value Ref Range Status   Specimen Description URINE, CLEAN CATCH  Final   Special Requests rocephin Immunocompromised  Final   Culture (A)  Final    >=100,000 COLONIES/mL GRAM NEGATIVE RODS CULTURE REINCUBATED FOR BETTER GROWTH    Report Status PENDING  Incomplete     Studies: No results found.  Scheduled Meds: . aspirin  81 mg Oral Daily  . cefTRIAXone (ROCEPHIN)  IV  1 g Intravenous Q24H  . enoxaparin (LOVENOX) injection  40 mg Subcutaneous Daily  . feeding supplement  1 Container Oral TID BM  . potassium chloride  10 mEq Intravenous Once  . sodium chloride flush  3 mL Intravenous Q12H   Continuous Infusions: . dextrose 5 % and 0.45% NaCl 1,000  mL with potassium chloride 20 mEq infusion 75 mL/hr at 04/20/16 0703       Time spent: 25 min    [facility]  [facility] Pager [phone]. If 7PM-7AM, please contact night-coverage at [email or web address], Office  [phone]  password [email or web address] 04/21/2016, 1:23 PM  LOS: 8 days

## 2016-04-22 LAB — BASIC METABOLIC PANEL
ANION GAP: 8 (ref 5–15)
CHLORIDE: 102 mmol/L (ref 101–111)
CO2: 28 mmol/L (ref 22–32)
Calcium: 8.3 mg/dL — ABNORMAL LOW (ref 8.9–10.3)
Creatinine, Ser: 0.61 mg/dL (ref 0.61–1.24)
GFR calc Af Amer: >60 mL/min (ref >60)
Glucose, Bld: 78 mg/dL (ref 65–99)
POTASSIUM: 3.8 mmol/L (ref 3.5–5.1)
SODIUM: 138 mmol/L (ref 135–145)

## 2016-04-22 LAB — URINE CULTURE: Culture: 30000 — AB

## 2016-04-22 LAB — MAGNESIUM: Magnesium: 1.6 mg/dL — ABNORMAL LOW (ref 1.7–2.4)

## 2016-04-22 MED ORDER — PRO-STAT SUGAR FREE PO LIQD
30.0000 mL | Freq: Two times a day (BID) | ORAL | Status: DC
  Administered 2016-04-22 – 2016-04-23 (×2): 30 mL via ORAL
  Filled 2016-04-22 (×2): qty 30

## 2016-04-22 MED ORDER — PIPERACILLIN-TAZOBACTAM 3.375 G IVPB
3.3750 g | Freq: Once | INTRAVENOUS | Status: DC
  Administered 2016-04-22: 3.375 g via INTRAVENOUS
  Filled 2016-04-22: qty 50

## 2016-04-22 MED ORDER — MAGNESIUM SULFATE 2 GM/50ML IV SOLN
2.0000 g | Freq: Once | INTRAVENOUS | Status: AC
  Administered 2016-04-22: 2 g via INTRAVENOUS
  Filled 2016-04-22: qty 50

## 2016-04-22 MED ORDER — SODIUM CHLORIDE 0.9% FLUSH: 10.0000 mL | INTRAVENOUS | Status: DC | PRN

## 2016-04-22 MED ORDER — SODIUM CHLORIDE 0.9 % IV SOLN
1.0000 g | INTRAVENOUS | Status: DC
  Administered 2016-04-22: 1 g via INTRAVENOUS
  Filled 2016-04-22 (×2): qty 1

## 2016-04-22 MED ORDER — PIPERACILLIN-TAZOBACTAM 3.375 G IVPB
3.3750 g | Freq: Three times a day (TID) | INTRAVENOUS | Status: DC
  Filled 2016-04-22 (×2): qty 50

## 2016-04-22 NOTE — Progress Notes (Signed)
Right Midline placed in Pts right arm by IV Team. Pt tolerated procedure well. Pt ready for Discharge.

## 2016-04-22 NOTE — Progress Notes (Signed)
Patient ID: Bill LentoWilliam M [name], male   DOB: 09/12/1940, 75 y.o.   MRN: [id number]  [facility] Progress Note  8 Days Post-Op  Subjective: Denies abdominal pain. Had BM this morning.  Objective: Vital signs in last 24 hours: Temp:  [97.5 F (36.4 C)-98.1 F (36.7 C)] 98.1 F (36.7 C) (09/12 0431) Pulse Rate:  [72-76] 73 (09/12 0431) Resp:  [17-18] 17 (09/12 0431) BP: (143-175)/(69-78) 175/70 (09/12 0431) SpO2:  [96 %-99 %] 99 % (09/12 0431) Weight:  [134 lb 4.2 oz (60.9 kg)] 134 lb 4.2 oz (60.9 kg) (09/11 2105) Last BM Date: 04/21/16  Intake/Output from previous day: 09/11 0701 - 09/12 0700 In: 1486.3 [P.O.:780; I.V.:706.3] Out: 1320 [Urine:1320] Intake/Output this shift: No intake/output data recorded.  PE: Gen: Alert, NAD, lying comfortably in bed Card: RRR Pulm: CTAB, effort nonlabored Abd: Soft, NT Neuro: aphasic, quadriplegic   Lab Results:  No results for input(s): WBC, HGB, HCT, PLT in the last 72 hours. BMET  Recent Labs  04/21/16 1146 04/22/16 0437  NA 139 138  K 3.3* 3.8  CL 103 102  CO2 28 28  GLUCOSE 119* 78  BUN <5* <5*  CREATININE 0.57* 0.61  CALCIUM 8.1* 8.3*   PT/INR No results for input(s): LABPROT, INR in the last 72 hours. CMP     Component Value Date/Time   NA 138 04/22/2016 0437   NA 141 12/28/2013 0555   K 3.8 04/22/2016 0437   K 3.7 12/28/2013 0555   CL 102 04/22/2016 0437   CL 107 12/28/2013 0555   CO2 28 04/22/2016 0437   CO2 29 12/28/2013 0555   GLUCOSE 78 04/22/2016 0437   GLUCOSE 75 12/28/2013 0555   BUN <5 (L) 04/22/2016 0437   BUN 7 12/28/2013 0555   CREATININE 0.61 04/22/2016 0437   CREATININE 0.76 12/28/2013 0555   CALCIUM 8.3 (L) 04/22/2016 0437   CALCIUM 8.1 (L) 12/28/2013 0555   PROT 7.1 04/13/2016 1725   PROT 7.4 12/27/2013 1619   ALBUMIN 4.2 04/13/2016 1725   ALBUMIN 3.7 12/27/2013 1619   AST 22 04/13/2016 1725   AST 15 12/27/2013 1619   ALT 17 04/13/2016 1725   ALT 15 12/27/2013 1619   ALKPHOS 77 04/13/2016 1725   ALKPHOS 106 12/27/2013 1619   BILITOT 1.2 04/13/2016 1725   BILITOT 0.3 12/27/2013 1619   GFRNONAA >60 04/22/2016 0437   GFRNONAA >60 12/28/2013 0555   GFRAA >60 04/22/2016 0437   GFRAA >60 12/28/2013 0555   Lipase     Component Value Date/Time   LIPASE 18 04/13/2016 1725       Studies/Results: No results found.  Anti-infectives: Anti-infectives    Start     Dose/Rate Route Frequency Ordered Stop   04/16/16 1600  cefTRIAXone (ROCEPHIN) 1 g in dextrose 5 % 50 mL IVPB     1 g 100 mL/hr over 30 Minutes Intravenous Every 24 hours 04/16/16 1539         Assessment/Plan Sigmoid volvulus -s/p reduction via flexible sigmoidoscopy 04/14/16 -no abdominal pain today -Dr. Johna SheriffHoxworth had long discussion with patient and family yesterday regarding treatment options. They have decided to proceed with conservative management. They understand that this could recur which would require surgery in the future, but for now they would like to avoid surgery H/o CVA, quadriplegia Mild hypokalemia - resolved, 3.8 today  FEN - IVF, clear liquids VTE - lovenox ID - none  Plan - Family would still like to proceed with conservative management. Ready for  discharge from a general surgery standpoint. General surgery will sign off.    LOS: 9 days    Edson Snowball , [facility] 04/22/2016, 10:06 AM Pager: [phone] Consults: [phone] Mon-Fri 7:00 am-4:30 pm Sat-Sun 7:00 am-11:30 am

## 2016-04-22 NOTE — Progress Notes (Signed)
Nutrition Follow-up  DOCUMENTATION CODES:   Underweight  INTERVENTION:  Continue Boost Breeze po TID, each supplement provides 250 kcal and 9 grams of protein.  Provide 30 ml Prostat po BID, each supplement provides 100 kcal and 15 grams of protein.   Encourage adequate PO intake.   NUTRITION DIAGNOSIS:   Increased nutrient needs related to acute illness as evidenced by estimated needs; ongoing  GOAL:   Patient will meet greater than or equal to 90% of their needs; progressing  MONITOR:   PO intake, Supplement acceptance, Diet advancement, Labs, Weight trends, Skin, I & O's  REASON FOR ASSESSMENT:   Low Braden    ASSESSMENT:   75 y.o. 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 Pt nonverbal, quadriplegic.   Family and patient decided on proceeding with conservative management and wanting to avoid surgery. Pt continues on a clear liquid diet. Meal completion has been varied from 0-90%. Wife at bedside has been encouraging the patient to consume his foods at meals. Pt currently has Boost Breeze ordered with varied consumption. RD to additionally order Prostat to aid in protein needs.   Labs and medications reviewed.   Diet Order:  Diet clear liquid Room service appropriate? Yes; Fluid consistency: Thin  Skin:  Wound (see comment) (wound on buttocks)  Last BM:  9/11  Height:   Ht Readings from Last 1 Encounters:  04/17/16 6' (1.829 m)    Weight:   Wt Readings from Last 1 Encounters:  04/21/16 134 lb 4.2 oz (60.9 kg)    Ideal Body Weight:  72.8 kg (adjusted for quadriplegia)  BMI:  Body mass index is 18.21 kg/m.  Estimated Nutritional Needs:   Kcal:  1700-1900  Protein:  75-90 grams  Fluid:  1.7 - 1.9 L/day  EDUCATION NEEDS:   No education needs identified at this time  Roslyn SmilingStephanie Trinady Milewski, MS, RD, LDN Pager # [phone] After hours/ weekend pager #  [phone]

## 2016-04-22 NOTE — Progress Notes (Signed)
Pharmacy Antibiotic Note  Bill [name] is a 75 y.o. male admitted on 04/13/2016 found to have resistant acinetobacter and morganella UTI.  Pharmacy has been consulted for Zosyn dosing.  Day #1 of Zosyn for resistant acinetobacter and morganella UTI. Did have fevers closer to admit. UA positive for nitrites. WBC wnl.  Plan: Give Zosyn 3.375g (30 min infusion) x 1, then start Zosyn 3.375 gm IV q8h (4 hour infusion) Monitor clinical picture, renal function, F/U LOT   Height: 6' (182.9 cm) Weight: 134 lb 4.2 oz (60.9 kg) IBW/kg (Calculated) : 77.6  Temp (24hrs), Avg:97.7 F (36.5 C), Min:97.5 F (36.4 C), Max:98.1 F (36.7 C)   Recent Labs Lab 04/18/16 0517 04/19/16 0815 04/20/16 0506 04/21/16 1146 04/22/16 0437  CREATININE 0.70 0.58* 0.56* 0.57* 0.61    Estimated Creatinine Clearance: 67.7 mL/min (by C-G formula based on SCr of 0.8 mg/dL).    No Known Allergies  Antimicrobials this admission: Ceftriaxone 9/6 >>  Zosyn 9/12 >>   Dose adjustments this admission: n/a  Microbiology results: 9/6 BCx: ngF 9/6 UCx: 30 K of acinetobacter and morganella  Thank you for allowing pharmacy to be a part of this patient's care.  Enzo BiNathan Audrena Talaga, PharmD, BCPS Clinical Pharmacist Pager [phone] 04/22/2016 11:02 AM

## 2016-04-22 NOTE — Care Management Important Message (Signed)
Important Message  Patient Details  Name: Bill LentoWilliam M [name] MRN: [id number] Date of Birth: 01/18/1941   Medicare Important Message Given:  Yes    [name] 04/22/2016, 1:00 PM

## 2016-04-22 NOTE — Progress Notes (Signed)
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   Surgery delayed due to hypokalemia. Gen. surgery discussed treatment options, at this time family has opted for conservative management at this time. General surgery has signed off  UTI  Patient came with a abnormal UA which showed positive nitrite.  ceftriaxone 1 g IV daily was started , blood culture 2 is negative so far. Urine culture growing Morganella morganii, Acinetobacter. Both 30,000 colonies, with multiple drug resistance called and discussed with infectious disease on-call Dr. Ninetta Lights. He recommends treating with IV Invanz for 7 more days.   H/o CVA -Severe debility, completely dependent for ADLs -Only takes ASA daily (will continue) -Was prescribed Lipitor at last hospitalization ([facility]), does not appear to be taking it -Unclear benefit from HTN/HLD control, wouldn't probably chenge long-term outcomes  Pulmonary nodule -Consider outpatient f/u  Hypokalemia  today potassium is 3.3 Will give 1 dose of 10 mEq IV KCl Will repeat BMP in am.   Hypomagnesemia-  replaced magnesium,  today, Check magnesium level in a.m.     DVT prophylaxis: Lovenox Code Status: Full code Family Communication: Discussed with patient's wife at bedside Disposition Plan: Likely home in next 24 hours after PICC line placement   Consultants:  Gastroenterology  Gen. Surgery   Procedures:  Sigmoid decompression  Antibiotics:  None    Subjective   Patient seen and examined, No new complaints.  Objective    Objective: Vitals:   04/21/16 1700 04/21/16 2105 04/22/16 0431 04/22/16 0900  BP: (!) 164/71 (!) 160/69 (!) 175/70 (!) 150/68  Pulse: 73 72 73 84  Resp: 18 17 17 18   Temp: 97.5 F (36.4 C) 97.5 F (36.4 C) 98.1 F (36.7 C) 98.1 F (36.7 C)  TempSrc: Oral Axillary Axillary Oral  SpO2: 97% 98% 99% 96%  Weight:  60.9 kg (134 lb 4.2 oz)    Height:        Intake/Output Summary (Last 24 hours) at 04/22/16 1610 Last data filed at 04/22/16 0730  Gross per 24 hour  Intake           563.75 ml  Output              700 ml  Net          -136.25 ml   Filed Weights   04/19/16 2141 04/20/16 2124 04/21/16 2105  Weight: 63.4 kg (139 lb 12.4 oz) 60.7 kg (133 lb 13.1 oz) 60.9 kg (134 lb 4.2 oz)    Examination:  General exam: Appears calm and comfortable  Respiratory system: Clear to auscultation. Respiratory effort normal. Cardiovascular system: S1 & S2 heard, RRR. No JVD, murmurs, rubs, gallops or clicks. No pedal edema. Gastrointestinal  system: Abdomen is nondistended, soft and nontender. No organomegaly or masses felt. Normal bowel sounds heard. Central nervous system: Nonverbal. No focal neurological deficits. Extremities: Symmetric 5 x 5 power. Skin: No rashes, lesions or ulcers Psychiatry: Judgement and insight appear normal. Mood & affect appropriate.    Data Reviewed: I have personally reviewed following labs and imaging studies Basic Metabolic Panel:  Recent Labs Lab 04/18/16 0517 04/18/16 1155 04/19/16 0815 04/20/16 0506 04/21/16 1146  04/22/16 0437  NA 137  --  139 137 139 138  K 2.4* 2.2* 2.9* 4.0 3.3* 3.8  CL 98*  --  103 106 103 102  CO2 27  --  28 25 28 28   GLUCOSE 119*  --  103* 96 119* 78  BUN <5*  --  <5* <5* <5* <5*  CREATININE 0.70  --  0.58* 0.56* 0.57* 0.61  CALCIUM 7.2*  --  7.6* 8.0* 8.1* 8.3*  MG 1.6*  --  1.7  --   --  1.6*    CBG:  Recent Labs Lab 04/16/16 2202  GLUCAP 126*    Recent Results (from the past 240 hour(s))  C difficile quick scan w PCR reflex     Status: None   Collection Time: 04/15/16 10:35 PM  Result Value Ref Range Status   C Diff antigen NEGATIVE NEGATIVE Final   C Diff toxin NEGATIVE NEGATIVE Final   C Diff interpretation No C. difficile detected.  Final  Culture, blood (Routine X 2) w Reflex to ID Panel     Status: None   Collection Time: 04/16/16  4:22 PM  Result Value Ref Range Status   Specimen Description BLOOD LEFT ARM  Final   Special Requests BOTTLES DRAWN AEROBIC ONLY 4CC  Final   Culture NO GROWTH 5 DAYS  Final   Report Status 04/21/2016 FINAL  Final  Culture, blood (Routine X 2) w Reflex to ID Panel     Status: None   Collection Time: 04/16/16  4:30 PM  Result Value Ref Range Status   Specimen Description BLOOD LEFT ARM  Final   Special Requests   Final    BOTTLES DRAWN AEROBIC AND ANAEROBIC BLUE 8CC RED 2CC   Culture NO GROWTH 5 DAYS  Final   Report Status 04/21/2016 FINAL  Final  Culture, Urine     Status: Abnormal   Collection Time: 04/19/16  8:49 AM  Result Value Ref Range Status   Specimen Description URINE, CLEAN CATCH  Final   Special Requests rocephin Immunocompromised  Final   Culture (A)  Final    30,000 COLONIES/mL MORGANELLA MORGANII 30,000 COLONIES/mL ACINETOBACTER CALCOACETICUS/BAUMANNII COMPLEX    Report Status 04/22/2016 FINAL  Final   Organism ID, Bacteria ACINETOBACTER CALCOACETICUS/BAUMANNII COMPLEX (A)  Final   Organism ID, Bacteria MORGANELLA MORGANII (A)  Final      Susceptibility   Acinetobacter calcoaceticus/baumannii  complex - MIC*    CEFTAZIDIME 4 SENSITIVE Sensitive     CEFTRIAXONE 16 INTERMEDIATE Intermediate     CIPROFLOXACIN <=0.25 SENSITIVE Sensitive     GENTAMICIN <=1 SENSITIVE Sensitive     IMIPENEM <=0.25 SENSITIVE Sensitive     PIP/TAZO <=4 SENSITIVE Sensitive     TRIMETH/SULFA <=20 SENSITIVE Sensitive     CEFEPIME 2 SENSITIVE Sensitive     AMPICILLIN/SULBACTAM <=2 SENSITIVE Sensitive     * 30,000 COLONIES/mL ACINETOBACTER CALCOACETICUS/BAUMANNII COMPLEX   Morganella morganii - MIC*    AMPICILLIN >=32 RESISTANT Resistant     CEFAZOLIN >=64 RESISTANT Resistant  CEFTRIAXONE >=64 RESISTANT Resistant     CIPROFLOXACIN 2 INTERMEDIATE Intermediate     GENTAMICIN <=1 SENSITIVE Sensitive     IMIPENEM 1 SENSITIVE Sensitive     NITROFURANTOIN 64 RESISTANT Resistant     TRIMETH/SULFA >=320 RESISTANT Resistant     AMPICILLIN/SULBACTAM >=32 RESISTANT Resistant     PIP/TAZO <=4 SENSITIVE Sensitive     * 30,000 COLONIES/mL MORGANELLA MORGANII     Studies: No results found.  Scheduled Meds: . aspirin  81 mg Oral Daily  . enoxaparin (LOVENOX) injection  40 mg Subcutaneous Daily  . feeding supplement  1 Container Oral TID BM  . feeding supplement (PRO-STAT SUGAR FREE 64)  30 mL Oral BID  . piperacillin-tazobactam (ZOSYN)  IV  3.375 g Intravenous Once  . piperacillin-tazobactam (ZOSYN)  IV  3.375 g Intravenous Q8H  . sodium chloride flush  3 mL Intravenous Q12H   Continuous Infusions: . dextrose 5 % and 0.45% NaCl 1,000 mL with potassium chloride 20 mEq infusion 75 mL/hr at 04/22/16 0553       Time spent: 25 min    [facility]  [facility] Pager [phone]. If 7PM-7AM, please contact night-coverage at [email or web address], Office  [phone]  password [email or web address] 04/22/2016, 4:10 PM  LOS: 9 days

## 2016-04-23 DIAGNOSIS — R911 Solitary pulmonary nodule: Secondary | ICD-10-CM

## 2016-04-23 DIAGNOSIS — K562 Volvulus: Principal | ICD-10-CM

## 2016-04-23 DIAGNOSIS — Z8673 Personal history of transient ischemic attack (TIA), and cerebral infarction without residual deficits: Secondary | ICD-10-CM

## 2016-04-23 DIAGNOSIS — E876 Hypokalemia: Secondary | ICD-10-CM

## 2016-04-23 LAB — MAGNESIUM: MAGNESIUM: 2.1 mg/dL (ref 1.7–2.4)

## 2016-04-23 LAB — ALDOSTERONE + RENIN ACTIVITY W/ RATIO
ALDO / PRA RATIO: UNDETERMINED
PRA LC/MS/MS: <0.167 ng/mL/hr — ABNORMAL LOW (ref 0.167–5.380)

## 2016-04-23 MED ORDER — ONDANSETRON HCL 4 MG PO TABS: 4.0000 mg | ORAL_TABLET | Freq: Four times a day (QID) | ORAL | 0 refills | Status: AC | PRN

## 2016-04-23 MED ORDER — SODIUM CHLORIDE 0.9 % IV SOLN
1.0000 g | INTRAVENOUS | Status: DC
  Administered 2016-04-23: 1 g via INTRAVENOUS
  Filled 2016-04-23: qty 1

## 2016-04-23 MED ORDER — MAGNESIUM SULFATE IN D5W 1-5 GM/100ML-% IV SOLN
1.0000 g | Freq: Once | INTRAVENOUS | Status: DC
  Filled 2016-04-23: qty 100

## 2016-04-23 MED ORDER — SODIUM CHLORIDE 0.9 % IV SOLN: 1.0000 g | INTRAVENOUS | 0 refills | Status: AC

## 2016-04-23 NOTE — Care Management Note (Addendum)
Case Management Note  Patient Details  Name: Bill LentoWilliam M [name] MRN: [id number] Date of Birth: 11/05/1940  Subjective/Objective:     CM following for progression and d/c planning.                Action/Plan: 04/23/2016 Multiple meetings with pt and wife re d/c plan. Pt is active with [facility] [place], [place]. [place] lives in [place], [place]. [facility] to [facility] the pt for the morning and transport to [facility] [place] for the day where he will receive MD care, IV antibiotics and any care that he may need. He is then [facility] for the evening/night.  Working with Eunice Blaseebbie of [facility], who is communicating with the [facility] staff re this pt needs. Info forwarded by Eunice Blaseebbie to the [facility] MD, Dr Purcell Moutoneilly who has spoke with Dr Sharl MaLama re ongoing IV antibiotics and use of Midline for these meds.  Per pt wife there are no DME needs for the home at this time. Pt will d/c to home via ambulance.   Expected Discharge Date:      04/23/2016            Expected Discharge Plan:  Home w Home Health Services  In-House Referral:  NA  Discharge planning Services  CM Consult  Post Acute Care Choice:  Home Health Choice offered to:  Spouse  DME Arranged:  N/A DME Agency:  NA  HH Arranged:  RN, Nurse's Aide HH Agency:   ([facility], [place], [place])  Status of Service:  Completed, signed off  If discussed at [employer] Meetings, dates discussed:    Additional Comments:  Starlyn SkeansRoyal, Ricci Dirocco U, RN 04/23/2016, 12:31 PM

## 2016-04-23 NOTE — Discharge Summary (Signed)
Physician Discharge Summary  Bill Bill [name] MRN: [id number] DOB/AGE: [birth year] 75 y.o.  PCP: Talbert Cage, FNP   Admit date: 04/13/2016 Discharge date: 04/23/2016  Discharge Diagnoses:    Principal Problem:   Sigmoid volvulus ([place]) Active Problems:   H/O: CVA (cerebrovascular accident)   Pulmonary nodule   Hypokalemia    Follow-up recommendations Follow-up with PCP in 3-5 days , including all  additional recommended appointments as below Follow-up CBC, CMP in 3-5 days Patient is being set up with home health, to continue IV antibiotics PCP to consider PICC line removal once antibiotics are completed      Current Discharge Medication List    START taking these medications   Details  ertapenem 1 g in sodium chloride 0.9 % 50 mL Inject 1 g into the vein daily. Qty: 7 ampule, Refills: 0    ondansetron (ZOFRAN) 4 MG tablet Take 1 tablet (4 mg total) by mouth every 6 (six) hours as needed for nausea. Qty: 20 tablet, Refills: 0      CONTINUE these medications which have NOT CHANGED   Details  aspirin 81 MG chewable tablet Chew 81 mg by mouth daily. Crush tablet    Multiple Vitamin (MULTIVITAMIN WITH MINERALS) TABS tablet Take 1 tablet by mouth daily. Crush tablet         Discharge Condition: Stable   Discharge Instructions Get Medicines reviewed and adjusted: Please take all your medications with you for your next visit with your Primary MD  Please request your Primary MD to go over all hospital tests and procedure/radiological results at the follow up, please ask your Primary MD to get all Hospital records sent to his/her office.  If you experience worsening of your admission symptoms, develop shortness of breath, life threatening emergency, suicidal or homicidal thoughts you must seek medical attention immediately by calling 911 or calling your MD immediately if symptoms less severe.  You must read complete instructions/literature along with all the  possible adverse reactions/side effects for all the Medicines you take and that have been prescribed to you. Take any new Medicines after you have completely understood and accpet all the possible adverse reactions/side effects.   Do not drive when taking Pain medications.   Do not take more than prescribed Pain, Sleep and Anxiety Medications  Special Instructions: If you have smoked or chewed Tobacco in the last 2 yrs please stop smoking, stop any regular Alcohol and or any Recreational drug use.  Wear Seat belts while driving.  Please note  You were cared for by a hospitalist during your hospital stay. Once you are discharged, your primary care physician will handle any further medical issues. Please note that NO REFILLS for any discharge medications will be authorized once you are discharged, as it is imperative that you return to your primary care physician (or establish a relationship with a primary care physician if you do not have one) for your aftercare needs so that they can reassess your need for medications and monitor your lab values.  Discharge Instructions    Diet - low sodium heart healthy    Complete by:  As directed    Increase activity slowly    Complete by:  As directed        No Known Allergies    Disposition: Final discharge disposition not confirmed   Consults: General surgery Gastroenterology     Significant Diagnostic Studies:  Dg Abdomen 1 View  Result Date: 04/14/2016 CLINICAL DATA:  Status post colonoscopy.  Initial encounter. EXAM: ABDOMEN - 1 VIEW COMPARISON:  CT of the abdomen and pelvis performed 04/13/2016 FINDINGS: The colon is filled with air and a small to moderate amount of stool. No free intra-abdominal air is seen, though evaluation for free air is limited on a single supine view. No acute osseous abnormalities are identified. Contrast is seen within the bladder. IMPRESSION: Colon filled with air and small to moderate amount of stool. No  free intra-abdominal air seen. Electronically Signed   By: Garald Balding M.D.   On: 04/14/2016 01:18   Ct Abdomen Pelvis W Contrast  Result Date: 04/13/2016 CLINICAL DATA:  75 year old Bill [name] with right side abdominal pain and diarrhea. Initial encounter. EXAM: CT ABDOMEN AND PELVIS WITH CONTRAST TECHNIQUE: Multidetector CT imaging of the abdomen and pelvis was performed using the standard protocol following bolus administration of intravenous contrast. CONTRAST:  120m ISOVUE-300 IOPAMIDOL (ISOVUE-300) INJECTION 61% COMPARISON:  Abdomen ultrasound 10/08/2015. FINDINGS: Partially visible 6 mm right lung nodule on series 4, image 1. Mild costophrenic angle scarring/ atelectasis. No pericardial or pleural effusion. Degenerative changes in the spine. Osteopenia. No acute osseous abnormality identified. The rectum is distended with fluid. The sigmoid colon is highly redundant and is gas and fluid distended tracking into the right upper quadrant. Following the distal sigmoid colon towards the rectum there is a swirling of the mesentery (see coronal images series 5, images 30 through 46). The left colon is mildly distended with gas, fluid, and some stool but otherwise negative. The transverse colon is redundant and distended with gas and stool. The proximal transverse and right colon contains stool throughout but are somewhat less distended. Negative cecum aside from retained stool. Negative terminal ileum. No dilated small bowel. Decompressed stomach and duodenum. Small volume abdominal free fluid in the right upper quadrant and tracking along the redundant sigmoid colon and in the sigmoid mesentery. no abdominal free air. Liver, gallbladder, spleen, pancreas and adrenal glands are within normal limits. The portal venous system is patent. No portal venous gas. Aortoiliac calcified atherosclerosis noted. Major arterial structures are patent. No lymphadenopathy. IMPRESSION: 1. Positive for Sigmoid Volvulus. This is best  demonstrated on the coronal images (coronal images 30-46). The sigmoid colon is highly redundant and is gas distended tracking up into the right upper quadrant, rather than the left upper quadrant as is more typical for sigmoid volvulus. 2. Small volume of free fluid in the right upper quadrant and in the sigmoid mesenteric. No free air. 3. Highly redundant more proximal colon also distended with gas, fluid, and stool. No dilated small bowel. 4. Partially visible right lung nodule in the middle lobe on image 1. Recommend non-emergent follow-up chest CT (noncontrast should suffice). Electronically Signed   By: HGenevie AnnM.D.   On: 04/13/2016 21:01        Filed Weights   04/20/16 2124 04/21/16 2105 04/22/16 2129  Weight: 60.7 kg (133 lb 13.1 oz) 60.9 kg (134 lb 4.2 oz) Bill.3 kg (130 lb 11.7 oz)     Microbiology: Recent Results (from the past 240 hour(s))  C difficile quick scan w PCR reflex     Status: None   Collection Time: 04/15/16 10:35 PM  Result Value Ref Range Status   C Diff antigen NEGATIVE NEGATIVE Final   C Diff toxin NEGATIVE NEGATIVE Final   C Diff interpretation No C. difficile detected.  Final  Culture, blood (Routine X 2) w Reflex to ID Panel     Status: None   Collection Time:  04/16/16  4:22 PM  Result Value Ref Range Status   Specimen Description BLOOD LEFT ARM  Final   Special Requests BOTTLES DRAWN AEROBIC ONLY 4CC  Final   Culture NO GROWTH 5 DAYS  Final   Report Status 04/21/2016 FINAL  Final  Culture, blood (Routine X 2) w Reflex to ID Panel     Status: None   Collection Time: 04/16/16  4:30 PM  Result Value Ref Range Status   Specimen Description BLOOD LEFT ARM  Final   Special Requests   Final    BOTTLES DRAWN AEROBIC AND ANAEROBIC BLUE 8CC RED [place]   Culture NO GROWTH 5 DAYS  Final   Report Status 04/21/2016 FINAL  Final  Culture, Urine     Status: Abnormal   Collection Time: 04/19/16  8:49 AM  Result Value Ref Range Status   Specimen Description URINE, CLEAN  CATCH  Final   Special Requests rocephin Immunocompromised  Final   Culture (A)  Final    30,000 COLONIES/mL MORGANELLA MORGANII 30,000 COLONIES/mL ACINETOBACTER CALCOACETICUS/BAUMANNII COMPLEX    Report Status 04/22/2016 FINAL  Final   Organism ID, Bacteria ACINETOBACTER CALCOACETICUS/BAUMANNII COMPLEX (A)  Final   Organism ID, Bacteria MORGANELLA MORGANII (A)  Final      Susceptibility   Acinetobacter calcoaceticus/baumannii complex - MIC*    CEFTAZIDIME 4 SENSITIVE Sensitive     CEFTRIAXONE 16 INTERMEDIATE Intermediate     CIPROFLOXACIN <=0.25 SENSITIVE Sensitive     GENTAMICIN <=1 SENSITIVE Sensitive     IMIPENEM <=0.25 SENSITIVE Sensitive     PIP/TAZO <=4 SENSITIVE Sensitive     TRIMETH/SULFA <=20 SENSITIVE Sensitive     CEFEPIME 2 SENSITIVE Sensitive     AMPICILLIN/SULBACTAM <=2 SENSITIVE Sensitive     * 30,000 COLONIES/mL ACINETOBACTER CALCOACETICUS/BAUMANNII COMPLEX   Morganella morganii - MIC*    AMPICILLIN >=32 RESISTANT Resistant     CEFAZOLIN >=64 RESISTANT Resistant     CEFTRIAXONE >=64 RESISTANT Resistant     CIPROFLOXACIN 2 INTERMEDIATE Intermediate     GENTAMICIN <=1 SENSITIVE Sensitive     IMIPENEM 1 SENSITIVE Sensitive     NITROFURANTOIN 64 RESISTANT Resistant     TRIMETH/SULFA >=320 RESISTANT Resistant     AMPICILLIN/SULBACTAM >=32 RESISTANT Resistant     PIP/TAZO <=4 SENSITIVE Sensitive     * 30,000 COLONIES/mL MORGANELLA MORGANII       Blood Culture    Component Value Date/Time   SDES URINE, CLEAN CATCH 04/19/2016 0849   SPECREQUEST rocephin Immunocompromised 04/19/2016 0849   CULT (A) 04/19/2016 0849    30,000 COLONIES/mL MORGANELLA MORGANII 30,000 COLONIES/mL ACINETOBACTER CALCOACETICUS/BAUMANNII COMPLEX    REPTSTATUS 04/22/2016 FINAL 04/19/2016 0849      Labs: Results for orders placed or performed during the hospital encounter of 04/13/16 (from the past 48 hour(s))  Basic metabolic panel     Status: Abnormal   Collection Time: 04/21/16  11:46 AM  Result Value Ref Range   Sodium 139 135 - 145 mmol/L   Potassium 3.3 (L) 3.5 - 5.1 mmol/L   Chloride 103 101 - 111 mmol/L   CO2 28 22 - 32 mmol/L   Glucose, Bld 119 (H) 65 - 99 mg/dL   BUN <5 (L) 6 - 20 mg/dL   Creatinine, Ser 0.57 (L) 0.61 - 1.24 mg/dL   Calcium 8.1 (L) 8.9 - 10.3 mg/dL   GFR calc non Af Amer >60 >60 mL/min   GFR calc Af Amer >60 >60 mL/min    Comment: (NOTE) The eGFR has been  calculated using the CKD EPI equation. This calculation has not been validated in all clinical situations. eGFR's persistently <60 mL/min signify possible Chronic Kidney Disease.    Anion gap 8 5 - 15  Magnesium     Status: Abnormal   Collection Time: 04/22/16  4:37 AM  Result Value Ref Range   Magnesium 1.6 (L) 1.7 - 2.4 mg/dL  Basic metabolic panel     Status: Abnormal   Collection Time: 04/22/16  4:37 AM  Result Value Ref Range   Sodium 138 135 - 145 mmol/L   Potassium 3.8 3.5 - 5.1 mmol/L   Chloride 102 101 - 111 mmol/L   CO2 28 22 - 32 mmol/L   Glucose, Bld 78 65 - 99 mg/dL   BUN <5 (L) 6 - 20 mg/dL   Creatinine, Ser 0.61 0.61 - 1.24 mg/dL   Calcium 8.3 (L) 8.9 - 10.3 mg/dL   GFR calc non Af Amer >60 >60 mL/min   GFR calc Af Amer >60 >60 mL/min    Comment: (NOTE) The eGFR has been calculated using the CKD EPI equation. This calculation has not been validated in all clinical situations. eGFR's persistently <60 mL/min signify possible Chronic Kidney Disease.    Anion gap 8 5 - 15  Magnesium     Status: None   Collection Time: 04/23/16  4:45 AM  Result Value Ref Range   Magnesium 2.1 1.7 - 2.4 mg/dL     Lipid Panel  No results found for: CHOL, TRIG, HDL, CHOLHDL, VLDL, LDLCALC, LDLDIRECT   No results found for: HGBA1C   Lab Results  Component Value Date   CREATININE 0.61 04/22/2016     HPI :   75 y.o. 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 History of lower GI bleed-diverticular bleed vs hemorrhoids 12/2013 presented to the emergency room with abdominal pain of one day duration. Patient with history of stroke. Not able to speak. Patient also with quadriplegia. History obtained from family members and wife. According to wife patient was doing okay until this morning. Had a breakfast without any issue. Subsequently started complaining of lower quadrant abdominal pain. Pain worse with oral intake. Decided to bring him to the ER. CT scan showed sigmoid volvulus. GI is consulted for further management. Again, unable to get any history from patient. GI consulted and volvulus reduced by flex Sig Gen surgery evaluated for need for surgery    HOSPITAL COURSE:   Sigmoid volvulus, s/p reduction via flexible sigmoidoscopy 04/14/16 -Patient with subacute onset of abdominal pain found to have volvulus on CT Dr. Excell Seltzer had long discussion with patient and family yesterday regarding treatment options. They have decided to proceed with conservative management. They understand that this could recur which would require surgery in the future, but for now they would like to avoid surgery General surgery has signed off  UTI  Patient came with a abnormal UA which showed positive nitrite.  ceftriaxone 1 g IV daily was started , blood culture 2 is negative so far. Urine culture growing Morganella morganii, Acinetobacter. Both 30,000 colonies, with multiple drug resistance called and discussed with infectious disease on-call Dr. Johnnye Sima. He recommends treating with IV Invanz for 7 more days.PICC line in place    H/o CVA -Severe debility, completely dependent for ADLs -Only takes ASA daily (will continue) -Was prescribed Lipitor at last hospitalization ([facility] or [facility]), does not appear  to be taking it -Unclear benefit from HTN/HLD control, wouldn't probably chenge long-term  outcomes  Pulmonary nodule -Consider outpatient f/u  Hypokalemia  today potassium is 3.8 Will give 1 dose of 10 mEq IV KCl Will repeat BMP in am.   Hypomagnesemia-  replaced magnesium,     Discharge Exam:  Blood pressure (!) 163/73, pulse 73, temperature 98.4 F (36.9 C), temperature source Oral, resp. rate 18, height 6' (1.829 m), weight Bill.3 kg (130 lb 11.7 oz), SpO2 98 %.   General:   Alert,  Aphasic. Responds to verbal, and. Lungs:  Clear throughout to auscultation.   No wheezes, crackles, or rhonchi. No acute distress. Heart:  Regular rate and rhythm; no murmurs, clicks, rubs,  or gallops. Abdomen: Tympanic abdomen. Nontender. Hyperactive bowel sounds. LE: no edema    Follow-up Information    BROOKS, Valetta Mole, FNP .   Specialty:  Nurse Practitioner Contact information: [address] [place] [address] [phone]           Signed: Reyne Dumas 04/23/2016, 11:16 AM        Time spent >45 mins

## 2016-04-24 NOTE — Progress Notes (Signed)
Spoke with Jae DireKate (pharmacist) concerning what type of line patient was discharged home with.

## 2017-02-01 ENCOUNTER — Observation Stay
Admission: EM | Admit: 2017-02-01 | Discharge: 2017-02-02 | Disposition: A | Discharge status: Skilled Nursing Facility | Payer: Medicare (Managed Care) | Attending: Internal Medicine | Admitting: Internal Medicine

## 2017-02-01 ENCOUNTER — Encounter: Payer: Self-pay | Admitting: Emergency Medicine

## 2017-02-01 ENCOUNTER — Emergency Department: Payer: Medicare (Managed Care)

## 2017-02-01 DIAGNOSIS — I469 Cardiac arrest, cause unspecified: Principal | ICD-10-CM | POA: Diagnosis present

## 2017-02-01 DIAGNOSIS — I10 Essential (primary) hypertension: Secondary | ICD-10-CM | POA: Diagnosis not present

## 2017-02-01 DIAGNOSIS — R42 Dizziness and giddiness: Secondary | ICD-10-CM | POA: Diagnosis not present

## 2017-02-01 DIAGNOSIS — R740 Nonspecific elevation of levels of transaminase and lactic acid dehydrogenase [LDH]: Secondary | ICD-10-CM | POA: Insufficient documentation

## 2017-02-01 DIAGNOSIS — Z79899 Other long term (current) drug therapy: Secondary | ICD-10-CM | POA: Diagnosis not present

## 2017-02-01 DIAGNOSIS — I69320 Aphasia following cerebral infarction: Secondary | ICD-10-CM | POA: Diagnosis not present

## 2017-02-01 DIAGNOSIS — R092 Respiratory arrest: Secondary | ICD-10-CM

## 2017-02-01 DIAGNOSIS — R7989 Other specified abnormal findings of blood chemistry: Secondary | ICD-10-CM

## 2017-02-01 DIAGNOSIS — Z7982 Long term (current) use of aspirin: Secondary | ICD-10-CM | POA: Diagnosis not present

## 2017-02-01 DIAGNOSIS — Z8673 Personal history of transient ischemic attack (TIA), and cerebral infarction without residual deficits: Secondary | ICD-10-CM

## 2017-02-01 HISTORY — DX: Essential (primary) hypertension: I10

## 2017-02-01 LAB — MRSA PCR SCREENING: MRSA by PCR: NEGATIVE

## 2017-02-01 LAB — COMPREHENSIVE METABOLIC PANEL
ALBUMIN: 3.7 g/dL (ref 3.5–5.0)
ALK PHOS: 65 U/L (ref 38–126)
ALT: 23 U/L (ref 17–63)
AST: 32 U/L (ref 15–41)
Anion gap: 7 (ref 5–15)
BUN: 11 mg/dL (ref 6–20)
CALCIUM: 8.6 mg/dL — AB (ref 8.9–10.3)
CO2: 29 mmol/L (ref 22–32)
CREATININE: 0.63 mg/dL (ref 0.61–1.24)
Chloride: 104 mmol/L (ref 101–111)
GFR calc Af Amer: >60 mL/min (ref >60)
GFR calc non Af Amer: >60 mL/min (ref >60)
GLUCOSE: 136 mg/dL — AB (ref 65–99)
Potassium: 3.5 mmol/L (ref 3.5–5.1)
SODIUM: 140 mmol/L (ref 135–145)
Total Bilirubin: 0.5 mg/dL (ref 0.3–1.2)
Total Protein: 6.7 g/dL (ref 6.5–8.1)

## 2017-02-01 LAB — CBC WITH DIFFERENTIAL/PLATELET
BASOS ABS: 0.1 10*3/uL (ref 0–0.1)
BASOS PCT: 1 %
Eosinophils Absolute: 0.2 10*3/uL (ref 0–0.7)
Eosinophils Relative: 3 %
HEMATOCRIT: 40 % (ref 40.0–52.0)
HEMOGLOBIN: 13.5 g/dL (ref 13.0–18.0)
Lymphocytes Relative: 22 %
Lymphs Abs: 1.7 10*3/uL (ref 1.0–3.6)
MCH: 31.5 pg (ref 26.0–34.0)
MCHC: 33.8 g/dL (ref 32.0–36.0)
MCV: 93.3 fL (ref 80.0–100.0)
Monocytes Absolute: 0.5 10*3/uL (ref 0.2–1.0)
Monocytes Relative: 6 %
NEUTROS ABS: 5.3 10*3/uL (ref 1.4–6.5)
NEUTROS PCT: 68 %
Platelets: 144 10*3/uL — ABNORMAL LOW (ref 150–440)
RBC: 4.28 MIL/uL — ABNORMAL LOW (ref 4.40–5.90)
RDW: 14.5 % (ref 11.5–14.5)
WBC: 7.9 10*3/uL (ref 3.8–10.6)

## 2017-02-01 LAB — TROPONIN I
Troponin I: <0.03 ng/mL (ref <0.03)
Troponin I: <0.03 ng/mL (ref <0.03)

## 2017-02-01 LAB — LACTIC ACID, PLASMA
LACTIC ACID, VENOUS: 2.5 mmol/L — AB (ref 0.5–1.9)
LACTIC ACID, VENOUS: 1.6 mmol/L (ref 0.5–1.9)

## 2017-02-01 LAB — ACETAMINOPHEN LEVEL: Acetaminophen (Tylenol), Serum: <10 ug/mL — ABNORMAL LOW (ref 10–30)

## 2017-02-01 LAB — LIPASE, BLOOD: Lipase: 23 U/L (ref 11–51)

## 2017-02-01 LAB — ETHANOL: Alcohol, Ethyl (B): <5 mg/dL (ref <5)

## 2017-02-01 LAB — SALICYLATE LEVEL: Salicylate Lvl: <7 mg/dL (ref 2.8–30.0)

## 2017-02-01 MED ORDER — ACETAMINOPHEN 325 MG PO TABS: 650.0000 mg | ORAL_TABLET | Freq: Four times a day (QID) | ORAL | Status: DC | PRN

## 2017-02-01 MED ORDER — ENOXAPARIN SODIUM 40 MG/0.4ML ~~LOC~~ SOLN
40.0000 mg | SUBCUTANEOUS | Status: DC
  Administered 2017-02-01: 40 mg via SUBCUTANEOUS
  Filled 2017-02-01: qty 0.4

## 2017-02-01 MED ORDER — ONDANSETRON HCL 4 MG/2ML IJ SOLN: 4.0000 mg | Freq: Four times a day (QID) | INTRAMUSCULAR | Status: DC | PRN

## 2017-02-01 MED ORDER — ACETAMINOPHEN 650 MG RE SUPP: 650.0000 mg | Freq: Four times a day (QID) | RECTAL | Status: DC | PRN

## 2017-02-01 MED ORDER — SODIUM CHLORIDE 0.9 % IV BOLUS (SEPSIS)
1000.0000 mL | Freq: Once | INTRAVENOUS | Status: AC
  Administered 2017-02-01: 1000 mL via INTRAVENOUS

## 2017-02-01 MED ORDER — SODIUM CHLORIDE 0.9 % IV SOLN
INTRAVENOUS | Status: DC
  Administered 2017-02-01 – 2017-02-02 (×2): via INTRAVENOUS

## 2017-02-01 MED ORDER — ASPIRIN 81 MG PO CHEW
81.0000 mg | CHEWABLE_TABLET | Freq: Every day | ORAL | Status: DC
  Administered 2017-02-02: 81 mg via ORAL
  Filled 2017-02-01: qty 1

## 2017-02-01 MED ORDER — SPIRONOLACTONE 25 MG PO TABS
12.5000 mg | ORAL_TABLET | Freq: Every day | ORAL | Status: DC
  Administered 2017-02-02: 12.5 mg via ORAL
  Filled 2017-02-01: qty 1

## 2017-02-01 MED ORDER — ONDANSETRON HCL 4 MG PO TABS: 4.0000 mg | ORAL_TABLET | Freq: Four times a day (QID) | ORAL | Status: DC | PRN

## 2017-02-01 NOTE — ED Notes (Signed)
Pt transported to room 2A-244

## 2017-02-01 NOTE — H&P (Signed)
[facility] [place] [facility]   PATIENT NAME: Bill IvoryWilliam [name]    MR#:  [id number]  DATE OF BIRTH:  08/31/1940  DATE OF ADMISSION:  02/01/2017  PRIMARY CARE PHYSICIAN: Patient, No Pcp Per   REQUESTING/REFERRING PHYSICIAN: Scotty CourtStafford, MD  CHIEF COMPLAINT:   Chief Complaint  Patient presents with  . Post CPR    HISTORY OF PRESENT ILLNESS:  Bill [name]  is a 76 y.o. male who presents with Cardiac arrest and CPR. Patient lives in a nursing facility, and was found to be unresponsive and pulseless by report. CPR was performed and the patient's pulse was regained. He was brought to the ED for evaluation. Workup here is largely within normal limits except for mildly elevated lactic acid. Patient seems to be back to his baseline functioning and mental status. He has a history of prior stroke, and so has expressive aphasia. He responds appropriately to questions though, indicating yes or no and able to say a few words that are intelligible. Hospitals were called for admission for observation post resuscitation.  PAST MEDICAL HISTORY:   Past Medical History:  Diagnosis Date  . HTN (hypertension)   . Stroke [facility](HCC) 2002   completely dependent with ADLs/IADLs    PAST SURGICAL HISTORY:   Past Surgical History:  Procedure Laterality Date  . FLEXIBLE SIGMOIDOSCOPY Left 04/14/2016   Procedure: FLEXIBLE SIGMOIDOSCOPY;  Surgeon: Kathi DerParag Brahmbhatt, MD;  Location: [facility] ENDOSCOPY;  Service: Gastroenterology;  Laterality: Left;    SOCIAL HISTORY:   Social History  Substance Use Topics  . Smoking status: Never Smoker  . Smokeless tobacco: Never Used  . Alcohol use No    FAMILY HISTORY:  History reviewed. No pertinent family history.  DRUG ALLERGIES:  No Known Allergies  MEDICATIONS AT HOME:   Prior to Admission medications   Medication Sig Start Date End Date Taking? Authorizing Provider  acetaminophen (TYLENOL) 325 MG tablet Take 650 mg by mouth every 6 (six)  hours as needed.   Yes [provider]  aspirin 81 MG chewable tablet Chew 81 mg by mouth daily. Crush tablet   Yes [provider]  cholecalciferol (VITAMIN D) 1000 units tablet Take 2,000 Units by mouth daily.   Yes [provider]  spironolactone (ALDACTONE) 25 MG tablet Take 12.5 mg by mouth daily.   Yes [provider]  ondansetron (ZOFRAN) 4 MG tablet Take 1 tablet (4 mg total) by mouth every 6 (six) hours as needed for nausea. Patient not taking: Reported on 02/01/2017 04/23/16   Richarda OverlieAbrol, Nayana, MD    REVIEW OF SYSTEMS:  Review of Systems  Unable to perform ROS: Medical condition     VITAL SIGNS:   Vitals:   02/01/17 1559 02/01/17 1600  BP: 133/66   Pulse: 69   Temp: 97.3 F (36.3 C)   TempSrc: Oral   SpO2: 99%   Weight:  63.5 kg (140 lb)  Height:  5\' 10"  (1.778 m)   Wt Readings from Last 3 Encounters:  02/01/17 63.5 kg (140 lb)  04/22/16 59.3 kg (130 lb 11.7 oz)    PHYSICAL EXAMINATION:  Physical Exam  Vitals reviewed. Constitutional: He appears well-developed and well-nourished. No distress.  HENT:  Head: Normocephalic and atraumatic.  Mouth/Throat: Oropharynx is clear and moist.  Eyes: Conjunctivae and EOM are normal. Pupils are equal, round, and reactive to light. No scleral icterus.  Neck: Normal range of motion. Neck supple. No JVD present. No thyromegaly present.  Cardiovascular: Normal rate, regular rhythm  and intact distal pulses.  Exam reveals no gallop and no friction rub.   No murmur heard. Respiratory: Effort normal and breath sounds normal. No respiratory distress. He has no wheezes. He has no rales.  GI: Soft. Bowel sounds are normal. He exhibits no distension. There is no tenderness.  Musculoskeletal: Normal range of motion. He exhibits no edema.  No arthritis, no gout  Lymphadenopathy:    He has no cervical adenopathy.  Neurological: He is alert. No cranial nerve deficit.  Expressive aphasia - unchanged  Skin:  Skin is warm and dry. No rash noted. No erythema.  Psychiatric: He has a normal mood and affect. His behavior is normal. Judgment and thought content normal.    LABORATORY PANEL:   CBC  Recent Labs Lab 02/01/17 1614  WBC 7.9  HGB 13.5  HCT 40.0  PLT 144*   ------------------------------------------------------------------------------------------------------------------  Chemistries   Recent Labs Lab 02/01/17 1614  NA 140  K 3.5  CL 104  CO2 29  GLUCOSE 136*  BUN 11  CREATININE 0.63  CALCIUM 8.6*  AST 32  ALT 23  ALKPHOS 65  BILITOT 0.5   ------------------------------------------------------------------------------------------------------------------  Cardiac Enzymes  Recent Labs Lab 02/01/17 1614  TROPONINI <0.03   ------------------------------------------------------------------------------------------------------------------  RADIOLOGY:  Dg Chest Portable 1 View  Result Date: 02/01/2017 CLINICAL DATA:  Status post respiratory arrest and CPR EXAM: PORTABLE CHEST 1 VIEW COMPARISON:  None. FINDINGS: Cardiac shadow is within normal limits. The lungs are well aerated bilaterally. No focal infiltrate or sizable effusion is seen. No pneumothorax is noted. No rib abnormalities are seen. IMPRESSION: No acute abnormality noted. Electronically Signed   By: Alcide Clever M.D.   On: 02/01/2017 16:42    EKG:   Orders placed or performed during the hospital encounter of 02/01/17  . ED EKG  . ED EKG    IMPRESSION AND PLAN:  Principal Problem:   Cardiac arrest [facility]) - patient was reportedly found unresponsive and had CPR performed, unknown duration, but patient was resuscitated. He is currently at baseline mental status functioning, alert. We will admit him to telemetry floor overnight to monitor on telemetry, trend his cardiac enzymes, use gentle IV fluids until his lactate is within normal limits. Active Problems:   HTN (hypertension) - continue home meds   H/O:  CVA (cerebrovascular accident) - patient is aphasic at baseline, see history of present illness, continue home meds  All the records are reviewed and case discussed with ED provider. Management plans discussed with the patient and/or family.  DVT PROPHYLAXIS: SubQ lovenox  GI PROPHYLAXIS: None  ADMISSION STATUS: Observation  CODE STATUS: Full Code Status History    Date Active Date Inactive Code Status Order ID Comments User Context   04/14/2016  1:48 AM 04/23/2016  4:58 PM Full Code [id number]  Jonah Blue, MD Inpatient      TOTAL TIME TAKING CARE OF THIS PATIENT: 40 minutes.   Taym Twist FIELDING 02/01/2017, 6:28 PM  [facility]  Office  [phone]  CC: Primary care physician; Patient, No Pcp Per  Note:  This document was prepared using Dragon voice recognition software and may include unintentional dictation errors.

## 2017-02-01 NOTE — ED Triage Notes (Signed)
Pt presents to ED via EMS from [facility] post CPR. Per EMS facility did compressions due to finding pt pulseless and apneic. Pt returned to baseline by arrival. Blood sugar 164. Non verbal but alert

## 2017-02-01 NOTE — ED Provider Notes (Signed)
[facility] Emergency Department Provider Note  ____________________________________________  Time seen: Approximately 6:09 PM  I have reviewed the triage vital signs and the nursing notes.   HISTORY  Chief Complaint Post CPR  Level 5 caveat:  Portions of the history and physical were unable to be obtained due to: Nonverbal and poor historian   HPI Bill [name] is a 76 y.o. male [facility] after receiving CPR. He was noted to have stopped breathing and be unconscious. They felt like she also did not have a pulse so he received 2 minutes of CPR and regained consciousness and returned to baseline. EMS report no events during transport. Patient did not require any defibrillations or other ACLS medications. Reported to be at baseline state of health recently.     Past Medical History:  Diagnosis Date  . HTN (hypertension)   . Stroke [facility](HCC) 2002   completely dependent with ADLs/IADLs     Patient Active Problem List   Diagnosis Date Noted  . Cardiac arrest (HCC) 02/01/2017  . HTN (hypertension) 02/01/2017  . 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 H/O: CVA (cerebrovascular accident) 04/13/2016  . Pulmonary nodule 04/13/2016  . Hypokalemia 04/13/2016     Past Surgical History:  Procedure Laterality Date  . FLEXIBLE SIGMOIDOSCOPY Left 04/14/2016   Procedure: FLEXIBLE SIGMOIDOSCOPY;  Surgeon: Kathi DerParag Brahmbhatt, MD;  Location: [facility] ENDOSCOPY;  Service: Gastroenterology;  Laterality: Left;     Prior to Admission medications   Medication Sig Start Date End Date Taking? Authorizing Provider  acetaminophen (TYLENOL) 325 MG tablet Take 650 mg by mouth every 6 (six) hours as needed.   Yes [provider]  aspirin 81 MG chewable tablet Chew 81 mg by mouth daily. Crush tablet   Yes [provider]  cholecalciferol (VITAMIN D) 1000 units tablet Take 2,000 Units by mouth daily.   Yes [provider]   spironolactone (ALDACTONE) 25 MG tablet Take 12.5 mg by mouth daily.   Yes [provider]  ondansetron (ZOFRAN) 4 MG tablet Take 1 tablet (4 mg total) by mouth every 6 (six) hours as needed for nausea. Patient not taking: Reported on 02/01/2017 04/23/16   Richarda OverlieAbrol, Nayana, MD     Allergies Patient has no known allergies.   History reviewed. No pertinent family history.  Social History Social History  Substance Use Topics  . Smoking status: Never Smoker  . Smokeless tobacco: Never Used  . Alcohol use No    Review of Systems Unable to reliably obtained due to nonverbal and previous stroke. Patient denies any pain at all. Denies headache chest pain shortness of breath belly pain or back pain.  ____________________________________________   PHYSICAL EXAM:  VITAL SIGNS: ED Triage Vitals  Enc Vitals Group     BP 02/01/17 1559 133/66     Pulse Rate 02/01/17 1559 69     Resp --      Temp 02/01/17 1559 97.3 F (36.3 C)     Temp Source 02/01/17 1559 Oral     SpO2 02/01/17 1559 99 %     Weight 02/01/17 1600 140 lb (63.5 kg)     Height 02/01/17 1600 5\' 10"  (1.778 m)     Head Circumference --      [facility] Flow --      Pain Score --      Pain Loc --      Pain Edu? --      Excl. in GC? --  Vital signs reviewed, nursing assessments reviewed.   Constitutional:   Alert. Well appearing and in no distress. Eyes:   No scleral icterus.  EOMI. No nystagmus. No conjunctival pallor. PERRL. ENT   Head:   Normocephalic and atraumatic.   Nose:   No congestion/rhinnorhea.    Mouth/Throat:   MMM, no pharyngeal erythema. No peritonsillar mass.    Neck:   No meningismus. Full ROM Hematological/Lymphatic/Immunilogical:   No cervical lymphadenopathy. Cardiovascular:   RRR. Symmetric bilateral radial and DP pulses.  No murmurs.  Respiratory:   Normal respiratory effort without tachypnea/retractions. Breath sounds are clear and equal bilaterally. No  wheezes/rales/rhonchi. Gastrointestinal:   Soft and nontender. Non distended. There is no CVA tenderness.  No rebound, rigidity, or guarding. Genitourinary:   deferred Musculoskeletal:   Normal range of motion in all extremities. No joint effusions.  No lower extremity tenderness.  No edema. Neurologic:   Baseline verbal expressions, shakes head yes or no appropriately to questioning..  No gross acute focal neurologic deficits are appreciated.  Skin:    Skin is warm, dry and intact. No rash noted.  No petechiae, purpura, or bullae.  ____________________________________________    LABS (pertinent positives/negatives) (all labs ordered are listed, but only abnormal results are displayed) Labs Reviewed  COMPREHENSIVE METABOLIC PANEL - Abnormal; Notable for the following:       Result Value   Glucose, Bld 136 (*)    Calcium 8.6 (*)    All other components within normal limits  ACETAMINOPHEN LEVEL - Abnormal; Notable for the following:    Acetaminophen (Tylenol), Serum <10 (*)    All other components within normal limits  LACTIC ACID, PLASMA - Abnormal; Notable for the following:    Lactic Acid, Venous 2.5 (*)    All other components within normal limits  CBC WITH DIFFERENTIAL/PLATELET - Abnormal; Notable for the following:    RBC 4.28 (*)    Platelets 144 (*)    All other components within normal limits  URINE CULTURE  ETHANOL  LIPASE, BLOOD  TROPONIN I  SALICYLATE LEVEL  LACTIC ACID, PLASMA  URINALYSIS, COMPLETE (UACMP) WITH MICROSCOPIC   ____________________________________________   EKG  Interpreted by me Sinus rhythm rate of 81, normal axis and intervals. Right bundle branch block. Normal ST segments and T waves. No acute ischemic changes. No evidence of STEMI. Significant artifact somewhat limits interpretation.  ____________________________________________    RADIOLOGY  Dg Chest Portable 1 View  Result Date: 02/01/2017 CLINICAL DATA:  Status post respiratory  arrest and CPR EXAM: PORTABLE CHEST 1 VIEW COMPARISON:  None. FINDINGS: Cardiac shadow is within normal limits. The lungs are well aerated bilaterally. No focal infiltrate or sizable effusion is seen. No pneumothorax is noted. No rib abnormalities are seen. IMPRESSION: No acute abnormality noted. Electronically Signed   By: Alcide Clever M.D.   On: 02/01/2017 16:42    ____________________________________________   PROCEDURES Procedures  ____________________________________________   INITIAL IMPRESSION / ASSESSMENT AND PLAN / ED COURSE  Pertinent labs & imaging results that were available during my care of the patient were reviewed by me and considered in my medical decision making (see chart for details).  Patient evaluated after receiving out of hospital CPR. Vital signs and exam are unremarkable. Labs unremarkable except for lactic acid 2.5. Case discussed with hospitalist for telemetry observation.      ____________________________________________   FINAL CLINICAL IMPRESSION(S) / ED DIAGNOSES  Final diagnoses:  Respiratory arrest (HCC)  Elevated lactic acid level  New Prescriptions   No medications on file     Portions of this note were generated with dragon dictation software. Dictation errors may occur despite best attempts at proofreading.    Sharman Cheek, MD 02/01/17 [phone]

## 2017-02-02 ENCOUNTER — Other Ambulatory Visit: Payer: Self-pay | Admitting: Family

## 2017-02-02 ENCOUNTER — Observation Stay
Admit: 2017-02-02 | Discharge: 2017-02-02 | Disposition: A | Discharge status: Home / Self Care | Payer: Medicare (Managed Care) | Attending: Internal Medicine | Admitting: Internal Medicine

## 2017-02-02 DIAGNOSIS — R569 Unspecified convulsions: Secondary | ICD-10-CM

## 2017-02-02 LAB — CBC
HEMATOCRIT: 37.4 % — AB (ref 40.0–52.0)
Hemoglobin: 12.8 g/dL — ABNORMAL LOW (ref 13.0–18.0)
MCH: 31.5 pg (ref 26.0–34.0)
MCHC: 34.2 g/dL (ref 32.0–36.0)
MCV: 92.2 fL (ref 80.0–100.0)
Platelets: 131 10*3/uL — ABNORMAL LOW (ref 150–440)
RBC: 4.06 MIL/uL — ABNORMAL LOW (ref 4.40–5.90)
RDW: 14 % (ref 11.5–14.5)
WBC: 6.5 10*3/uL (ref 3.8–10.6)

## 2017-02-02 LAB — BASIC METABOLIC PANEL
Anion gap: 6 (ref 5–15)
BUN: 7 mg/dL (ref 6–20)
CO2: 28 mmol/L (ref 22–32)
Calcium: 8.6 mg/dL — ABNORMAL LOW (ref 8.9–10.3)
Chloride: 104 mmol/L (ref 101–111)
Creatinine, Ser: 0.42 mg/dL — ABNORMAL LOW (ref 0.61–1.24)
GFR calc Af Amer: >60 mL/min (ref >60)
GLUCOSE: 78 mg/dL (ref 65–99)
POTASSIUM: 3.6 mmol/L (ref 3.5–5.1)
Sodium: 138 mmol/L (ref 135–145)

## 2017-02-02 LAB — TROPONIN I: Troponin I: <0.03 ng/mL (ref <0.03)

## 2017-02-02 LAB — ECHOCARDIOGRAM COMPLETE
Height: 70 in
Weight: 1665.6 oz

## 2017-02-02 NOTE — Progress Notes (Signed)
Pt arrived from ED with family at bedside. Pt has expressive aphasia but can respond to yes/no questions appropriately. No c/o pain, no SOB. Skin and telemetry verified. Pt diet needs to be changed to pureed with nectar thick per wife and daughter. MD text paged to change diet order. No concerns offered at this time.

## 2017-02-02 NOTE — Progress Notes (Signed)
Initial Nutrition Assessment  DOCUMENTATION CODES:   Not applicable  INTERVENTION:  1. Magic cup TID with meals, each supplement provides 290 kcal and 9 grams of protein  NUTRITION DIAGNOSIS:   Biting/chewing difficulty related to dysphagia as evidenced by per patient/family report.  GOAL:   Patient will meet greater than or equal to 90% of their needs  MONITOR:   PO intake, I & O's, Labs, Weight trends, Supplement acceptance  REASON FOR ASSESSMENT:   Low Braden    ASSESSMENT:   Bill [name]  is a 76 y.o. male who presents with Cardiac arrest and CPR. Patient lives in a nursing facility, and was found to be unresponsive and pulseless by report. CPR was performed and the patient's pulse was regained  Spoke with Mr. Bill [name]'s family at bedside. Patient unable to provide any history. They deny any weight loss or decrease in PO intake recently. Quadraplegic, Patient has a history of multiple strokes, on pureed diet with honey thick liquids. Question ability to maintain hydration status on honey thick. Likely meeting calorie/protein needs given patient is a quad with no wounds. He had magic cup upon previous admission will provide. Physical exam appears to be WNL given quadriplegia. Labs and medications reviewed  Diet Order:  DIET - DYS 1 Room service appropriate? Yes; Fluid consistency: Honey Thick  Skin:  Reviewed, no issues  Last BM:  PTA  Height:   Ht Readings from Last 1 Encounters:  02/01/17 5\' 10"  (1.778 m)    Weight:   Wt Readings from Last 1 Encounters:  02/01/17 104 lb 1.6 oz (47.2 kg)    Ideal Body Weight:  75.45 kg  BMI:  Body mass index is 14.94 kg/m.  Estimated Nutritional Needs:   Kcal:  1130-1400 calories  Protein:  47-57 grams  Fluid:  >/= 1.1L  EDUCATION NEEDS:   No education needs identified at this time  Dionne AnoWilliam M. Mansi Tokar, MS, RD LDN Inpatient Clinical Dietitian Pager [phone]

## 2017-02-02 NOTE — Discharge Summary (Signed)
[facility] - [place] at [facility]   PATIENT NAME: Bill [name]    MR#:  [id number]  DATE OF BIRTH:  [birth year]  DATE OF ADMISSION:  02/01/2017 ADMITTING PHYSICIAN: Oralia Manis, MD  DATE OF DISCHARGE: 02/02/2017  PRIMARY CARE PHYSICIAN: Patient, No Pcp Per    ADMISSION DIAGNOSIS:  Respiratory arrest (HCC) [R09.2] Dizziness [R42] Elevated lactic acid level [R79.89]  DISCHARGE DIAGNOSIS:  Principal Problem:   Cardiac arrest [facility]) Active Problems:   H/O: CVA (cerebrovascular accident)   HTN (hypertension)   SECONDARY DIAGNOSIS:   Past Medical History:  Diagnosis Date  . HTN (hypertension)   . Stroke [facility]) 2002   completely dependent with ADLs/IADLs    HOSPITAL COURSE:   * Cardiac arrest [facility]) - patient was reportedly found unresponsive and had CPR performed, unknown duration, but patient was resuscitated. He is currently at baseline mental status functioning, alert.   No source of infection found.  Monitor on telemetry, remained stable, follow serial troponin and stable.  Patient is back to baseline, most likely the episode itself was not a true cardiac arrest.  He is stable and we are discharging back to nursing home, I spoke to his wife and she understands and agrees with the plan.  *  HTN (hypertension) - continue home meds *  H/O: CVA (cerebrovascular accident) - patient is aphasic at baseline, see history of present illness, continue home meds  DISCHARGE CONDITIONS:   Stable.  CONSULTS OBTAINED:  Treatment Team:  Dalia Heading, MD  DRUG ALLERGIES:  No Known Allergies  DISCHARGE MEDICATIONS:   Current Discharge Medication List    CONTINUE these medications which have NOT CHANGED   Details  acetaminophen (TYLENOL) 325 MG tablet Take 650 mg by mouth every 6 (six) hours as needed.    aspirin 81 MG chewable tablet Chew 81 mg by mouth daily. Crush tablet    cholecalciferol (VITAMIN D) 1000 units tablet Take 2,000 Units by  mouth daily.    spironolactone (ALDACTONE) 25 MG tablet Take 12.5 mg by mouth daily.    ondansetron (ZOFRAN) 4 MG tablet Take 1 tablet (4 mg total) by mouth every 6 (six) hours as needed for nausea. Qty: 20 tablet, Refills: 0         DISCHARGE INSTRUCTIONS:    Follow with PMD in 1-2 weeks.  If you experience worsening of your admission symptoms, develop shortness of breath, life threatening emergency, suicidal or homicidal thoughts you must seek medical attention immediately by calling 911 or calling your MD immediately  if symptoms less severe.  You Must read complete instructions/literature along with all the possible adverse reactions/side effects for all the Medicines you take and that have been prescribed to you. Take any new Medicines after you have completely understood and accept all the possible adverse reactions/side effects.   Please note  You were cared for by a hospitalist during your hospital stay. If you have any questions about your discharge medications or the care you received while you were in the hospital after you are discharged, you can call the unit and asked to speak with the hospitalist on call if the hospitalist that took care of you is not available. Once you are discharged, your primary care physician will handle any further medical issues. Please note that NO REFILLS for any discharge medications will be authorized once you are discharged, as it is imperative that you return to your primary care physician (or establish a relationship with a primary care physician  if you do not have one) for your aftercare needs so that they can reassess your need for medications and monitor your lab values.    Today   CHIEF COMPLAINT:   Chief Complaint  Patient presents with  . Post CPR    HISTORY OF PRESENT ILLNESS:  Bill [name]  is a 76 y.o. male presents with Cardiac arrest and CPR. Patient lives in a nursing facility, and was found to be unresponsive and  pulseless by report. CPR was performed and the patient's pulse was regained. He was brought to the ED for evaluation. Workup here is largely within normal limits except for mildly elevated lactic acid. Patient seems to be back to his baseline functioning and mental status. He has a history of prior stroke, and so has expressive aphasia. He responds appropriately to questions though, indicating yes or no and able to say a few words that are intelligible. Hospitals were called for admission for observation post resuscitation.  VITAL SIGNS:  Blood pressure (!) 146/73, pulse 67, temperature 98.5 F (36.9 C), temperature source Oral, resp. rate 16, height 5\' 10"  (1.778 m), weight 47.2 kg (104 lb 1.6 oz), SpO2 100 %.  I/O:   Intake/Output Summary (Last 24 hours) at 02/02/17 1314 Last data filed at 02/02/17 0300  Gross per 24 hour  Intake           473.75 ml  Output                0 ml  Net           473.75 ml    PHYSICAL EXAMINATION:   Vitals reviewed. Constitutional: He appears well-developed and well-nourished. No distress.  HENT:  Head: Normocephalic and atraumatic.  Mouth/Throat: Oropharynx is clear and moist.  Eyes: Conjunctivae and EOM are normal. Pupils are equal, round, and reactive to light. No scleral icterus.  Neck: Normal range of motion. Neck supple. No JVD present. No thyromegaly present.  Cardiovascular: Normal rate, regular rhythm and intact distal pulses.  Exam reveals no gallop and no friction rub.   No murmur heard. Respiratory: Effort normal and breath sounds normal. No respiratory distress. He has no wheezes. He has no rales.  GI: Soft. Bowel sounds are normal. He exhibits no distension. There is no tenderness.  Musculoskeletal: Normal range of motion. He exhibits no edema.  No arthritis, no gout  Lymphadenopathy:    He has no cervical adenopathy.  Neurological: He is alert. No cranial nerve deficit.  Expressive aphasia - unchanged  Skin: Skin is warm and dry. No rash  noted. No erythema.  Psychiatric: He has a normal mood and affect. His behavior is normal. Judgment and thought content normal.   DATA REVIEW:   CBC  Recent Labs Lab 02/02/17 0802  WBC 6.5  HGB 12.8*  HCT 37.4*  PLT 131*    Chemistries   Recent Labs Lab 02/01/17 1614 02/02/17 0802  NA 140 138  K 3.5 3.6  CL 104 104  CO2 29 28  GLUCOSE 136* 78  BUN 11 7  CREATININE 0.63 0.42*  CALCIUM 8.6* 8.6*  AST 32  --   ALT 23  --   ALKPHOS 65  --   BILITOT 0.5  --     Cardiac Enzymes  Recent Labs Lab 02/02/17 0802  TROPONINI <0.03    Microbiology Results  Results for orders placed or performed during the hospital encounter of 02/01/17  MRSA PCR Screening     Status: None  Collection Time: 02/01/17  8:03 PM  Result Value Ref Range Status   MRSA by PCR NEGATIVE NEGATIVE Final    Comment:        The GeneXpert MRSA Assay (FDA approved for NASAL specimens only), is one component of a comprehensive MRSA colonization surveillance program. It is not intended to diagnose MRSA infection nor to guide or monitor treatment for MRSA infections.     RADIOLOGY:  Dg Chest Portable 1 View  Result Date: 02/01/2017 CLINICAL DATA:  Status post respiratory arrest and CPR EXAM: PORTABLE CHEST 1 VIEW COMPARISON:  None. FINDINGS: Cardiac shadow is within normal limits. The lungs are well aerated bilaterally. No focal infiltrate or sizable effusion is seen. No pneumothorax is noted. No rib abnormalities are seen. IMPRESSION: No acute abnormality noted. Electronically Signed   By: Alcide Clever M.D.   On: 02/01/2017 16:42    EKG:   Orders placed or performed during the hospital encounter of 02/01/17  . ED EKG  . ED EKG      Management plans discussed with the patient, family and they are in agreement.  CODE STATUS:     Code Status Orders        Start     Ordered   02/01/17 1946  Full code  Continuous     02/01/17 1945    Code Status History    Date Active Date  Inactive Code Status Order ID Comments User Context   04/14/2016  1:48 AM 04/23/2016  4:58 PM Full Code [id number]  Jonah Blue, MD Inpatient    Advance Directive Documentation     Most Recent Value  Type of Advance Directive  Healthcare Power of Attorney  Pre-existing out of facility DNR order (yellow form or pink MOST form)  -  "MOST" Form in Place?  -      TOTAL TIME TAKING CARE OF THIS PATIENT: 35 minutes.    Altamese Dilling M.D on 02/02/2017 at 1:14 PM  Between 7am to 6pm - Pager - [phone]  After 6pm go to [email or web address] - password [email or web address]  [facility]  Office  [phone]  CC: Primary care physician; Patient, No Pcp Per   Note: This dictation was prepared with Dragon dictation along with smaller phrase technology. Any transcriptional errors that result from this process are unintentional.

## 2017-02-02 NOTE — Clinical Social Work Note (Addendum)
Clinical Social Work Assessment  Patient Details  Name: Bill LentoWilliam M [name] MRN: [id number] Date of Birth: 08/18/1940  Date of referral:  02/02/17               Reason for consult:  Facility Placement                Permission sought to share information with:  Family Supports, Magazine features editoracility Contact Representative Permission granted to share information::  Yes, Verbal Permission Granted  Name::     Anselmi,Hazel Spouse [phone] or Quita SkyeClark,Mary Daughter [phone]   Agency::  SNF admissions  Relationship::     Contact Information:     Housing/Transportation Living arrangements for the past 2 months:  Skilled Nursing Facility, Single Family Home Source of Information:    Patient Interpreter Needed:  None Criminal Activity/Legal Involvement Pertinent to Current Situation/Hospitalization:  No - Comment as needed Significant Relationships:  Adult Children, Spouse Lives with:  Spouse Do you feel safe going back to the place where you live?  Yes Need for family participation in patient care:  Yes (Comment)  Care giving concerns:  Patient and family do not have any concerns about returning back to SNF   Social Worker assessment / plan:  Patient is a 76 year old male who is alert and oriented x3 and participating in [facility] program.  Patient is nonverbal, however wife was at bedside, assessment completed by speaking with patient's wife.  [facility] to help give patient's wife a break.  Patient's family did not express any concerns [facility]  Patient's wife is the primary caregiver and she expressed the respite has been very helpful for her.  Patient's wife expressed the plan is for him to return back to home once his respite break has been completed.  Patient and family did not have any other questions or concerns.  Employment status:  Retired Health and safety inspectornsurance information:  Armed forces operational officerMedicare, [place] Recommend[place]:    Information / Referral to  community resources:  Skilled Nursing Facility  Patient/Family's Response to care:  Patient and family are in agreement to going back to SNF for respite.  Patient/Family's Understanding of and Emotional Response to Diagnosis, Current Treatment, and Prognosis:  Patient's family are aware of current treatment plan and prognosis.  Emotional Assessment Appearance:  Appears stated age Attitude/Demeanor/Rapport:    Affect (typically observed):  Appropriate, Calm, Stable Orientation:  Oriented to Self, Oriented to Place, Oriented to Situation Alcohol / Substance use:  Not Applicable Psych involvement (Current and /or in the community):  No (Comment)  Discharge Needs  Concerns to be addressed:  Lack of Support Readmission within the last 30 days:  No Current discharge risk:  None Barriers to Discharge:  No Barriers Identified   Darleene Cleavernterhaus, Ramyah Pankowski R, LCSWA 02/02/2017, 1:48 PM

## 2017-02-02 NOTE — Progress Notes (Signed)
Patient discharged via wheelchair and private vehicle. IV removed and catheter intact. All discharge instructions given and patient verbalizes understanding. Tele removed and returned. No prescriptions given to patient No distress noted.   

## 2017-02-02 NOTE — Clinical Social Work Note (Signed)
Patient to be d/c'ed today to [facility] [place].  Patient and family agreeable to plans will transport via [facility] transportation RN to call report to [phone] room [phone].  Windell MouldingEric Karmelo Bass, MSW, Theresia MajorsLCSWA [phone]

## 2017-02-02 NOTE — Care Management Obs Status (Signed)
MEDICARE OBSERVATION STATUS NOTIFICATION   Patient Details  Name: Bill LentoWilliam M Lafferty MRN: [id number] Date of Birth: 06/19/1941   Medicare Observation Status Notification Given:  No    Chapman FitchBOWEN, Jahkeem Kurka T, RN 02/02/2017, 1:45 PM

## 2017-02-02 NOTE — Progress Notes (Signed)
*  PRELIMINARY RESULTS* Echocardiogram 2D Echocardiogram has been performed.  Cristela BlueHege, Cailah Reach 02/02/2017, 11:01 AM

## 2017-02-05 ENCOUNTER — Ambulatory Visit: Payer: Medicare (Managed Care)

## 2017-02-09 ENCOUNTER — Ambulatory Visit: Payer: Medicare Other | Attending: Family

## 2017-06-15 ENCOUNTER — Other Ambulatory Visit: Payer: Self-pay

## 2017-06-15 ENCOUNTER — Encounter: Payer: Self-pay | Admitting: *Deleted

## 2017-06-15 ENCOUNTER — Inpatient Hospital Stay: Payer: No Typology Code available for payment source | Admitting: Anesthesiology

## 2017-06-15 ENCOUNTER — Inpatient Hospital Stay
Admission: EM | Admit: 2017-06-15 | Discharge: 2017-06-17 | DRG: 389 | Disposition: A | Discharge status: Home / Self Care | Payer: No Typology Code available for payment source | Attending: Internal Medicine | Admitting: Internal Medicine

## 2017-06-15 ENCOUNTER — Emergency Department: Payer: No Typology Code available for payment source

## 2017-06-15 ENCOUNTER — Encounter
Admission: EM | Disposition: A | Discharge status: Home / Self Care | Payer: Self-pay | Source: Home / Self Care | Attending: Internal Medicine

## 2017-06-15 DIAGNOSIS — I10 Essential (primary) hypertension: Secondary | ICD-10-CM | POA: Diagnosis present

## 2017-06-15 DIAGNOSIS — Z7401 Bed confinement status: Secondary | ICD-10-CM

## 2017-06-15 DIAGNOSIS — K562 Volvulus: Secondary | ICD-10-CM | POA: Diagnosis not present

## 2017-06-15 DIAGNOSIS — I69320 Aphasia following cerebral infarction: Secondary | ICD-10-CM

## 2017-06-15 DIAGNOSIS — D72829 Elevated white blood cell count, unspecified: Secondary | ICD-10-CM | POA: Diagnosis present

## 2017-06-15 DIAGNOSIS — Z79899 Other long term (current) drug therapy: Secondary | ICD-10-CM

## 2017-06-15 DIAGNOSIS — I252 Old myocardial infarction: Secondary | ICD-10-CM

## 2017-06-15 DIAGNOSIS — I69351 Hemiplegia and hemiparesis following cerebral infarction affecting right dominant side: Secondary | ICD-10-CM

## 2017-06-15 DIAGNOSIS — Z7982 Long term (current) use of aspirin: Secondary | ICD-10-CM

## 2017-06-15 DIAGNOSIS — E876 Hypokalemia: Secondary | ICD-10-CM | POA: Diagnosis present

## 2017-06-15 HISTORY — DX: Volvulus: K56.2

## 2017-06-15 HISTORY — DX: Aphagia: R13.0

## 2017-06-15 LAB — COMPREHENSIVE METABOLIC PANEL
ALBUMIN: 4.1 g/dL (ref 3.5–5.0)
ALK PHOS: 75 U/L (ref 38–126)
ALT: 14 U/L — ABNORMAL LOW (ref 17–63)
ANION GAP: 15 (ref 5–15)
AST: 24 U/L (ref 15–41)
BILIRUBIN TOTAL: 1.3 mg/dL — AB (ref 0.3–1.2)
BUN: 17 mg/dL (ref 6–20)
CALCIUM: 9 mg/dL (ref 8.9–10.3)
CO2: 27 mmol/L (ref 22–32)
Chloride: 104 mmol/L (ref 101–111)
Creatinine, Ser: 0.57 mg/dL — ABNORMAL LOW (ref 0.61–1.24)
Glucose, Bld: 84 mg/dL (ref 65–99)
POTASSIUM: 2.6 mmol/L — AB (ref 3.5–5.1)
Sodium: 146 mmol/L — ABNORMAL HIGH (ref 135–145)
TOTAL PROTEIN: 7.6 g/dL (ref 6.5–8.1)

## 2017-06-15 LAB — CBC
HEMATOCRIT: 41.6 % (ref 40.0–52.0)
HEMOGLOBIN: 14 g/dL (ref 13.0–18.0)
MCH: 31.8 pg (ref 26.0–34.0)
MCHC: 33.6 g/dL (ref 32.0–36.0)
MCV: 94.7 fL (ref 80.0–100.0)
Platelets: 187 10*3/uL (ref 150–440)
RBC: 4.4 MIL/uL (ref 4.40–5.90)
RDW: 14.1 % (ref 11.5–14.5)
WBC: 13.5 10*3/uL — AB (ref 3.8–10.6)

## 2017-06-15 LAB — LIPASE, BLOOD: Lipase: 16 U/L (ref 11–51)

## 2017-06-15 LAB — MAGNESIUM: Magnesium: 1.9 mg/dL (ref 1.7–2.4)

## 2017-06-15 SURGERY — SIGMOIDOSCOPY, FLEXIBLE
Anesthesia: General

## 2017-06-15 MED ORDER — ENOXAPARIN SODIUM 40 MG/0.4ML ~~LOC~~ SOLN
40.0000 mg | SUBCUTANEOUS | Status: DC
  Administered 2017-06-16 – 2017-06-17 (×2): 40 mg via SUBCUTANEOUS
  Filled 2017-06-15 (×2): qty 0.4

## 2017-06-15 MED ORDER — POTASSIUM CHLORIDE 10 MEQ/100ML IV SOLN
10.0000 meq | INTRAVENOUS | Status: AC
  Administered 2017-06-15 – 2017-06-16 (×4): 10 meq via INTRAVENOUS

## 2017-06-15 MED ORDER — POTASSIUM CHLORIDE 10 MEQ/100ML IV SOLN
10.0000 meq | INTRAVENOUS | Status: AC
  Filled 2017-06-15 (×4): qty 100

## 2017-06-15 MED ORDER — ACETAMINOPHEN 650 MG RE SUPP: 650.0000 mg | Freq: Four times a day (QID) | RECTAL | Status: DC | PRN

## 2017-06-15 MED ORDER — ONDANSETRON HCL 4 MG/2ML IJ SOLN: 4.0000 mg | Freq: Four times a day (QID) | INTRAMUSCULAR | Status: DC | PRN

## 2017-06-15 MED ORDER — PROPOFOL 500 MG/50ML IV EMUL
INTRAVENOUS | Status: DC | PRN
  Administered 2017-06-15: 75 ug/kg/min via INTRAVENOUS

## 2017-06-15 MED ORDER — ONDANSETRON HCL 4 MG PO TABS: 4.0000 mg | ORAL_TABLET | Freq: Four times a day (QID) | ORAL | Status: DC | PRN

## 2017-06-15 MED ORDER — PROPOFOL 500 MG/50ML IV EMUL
INTRAVENOUS | Status: AC
  Filled 2017-06-15: qty 50

## 2017-06-15 MED ORDER — PHENYLEPHRINE HCL 10 MG/ML IJ SOLN
INTRAMUSCULAR | Status: DC | PRN
  Administered 2017-06-15 (×5): 100 ug via INTRAVENOUS

## 2017-06-15 MED ORDER — ACETAMINOPHEN 325 MG PO TABS: 650.0000 mg | ORAL_TABLET | Freq: Four times a day (QID) | ORAL | Status: DC | PRN

## 2017-06-15 MED ORDER — SODIUM CHLORIDE 0.9 % IV SOLN
INTRAVENOUS | Status: DC | PRN
Start: 2017-06-15 — End: 2017-06-15
  Administered 2017-06-15: 19:00:00 via INTRAVENOUS

## 2017-06-15 MED ORDER — ALBUTEROL SULFATE (2.5 MG/3ML) 0.083% IN NEBU: 2.5000 mg | INHALATION_SOLUTION | RESPIRATORY_TRACT | Status: DC | PRN

## 2017-06-15 MED ORDER — POTASSIUM CHLORIDE IN NACL 40-0.9 MEQ/L-% IV SOLN
INTRAVENOUS | Status: DC
  Administered 2017-06-16 (×2): 75 mL/h via INTRAVENOUS
  Filled 2017-06-15 (×5): qty 1000

## 2017-06-15 MED ORDER — PROPOFOL 10 MG/ML IV BOLUS
INTRAVENOUS | Status: DC | PRN
  Administered 2017-06-15: 20 mg via INTRAVENOUS

## 2017-06-15 NOTE — Progress Notes (Signed)
Arrived to do IV but patient had been transported to [place], RN to notify when patient has returned.  Gasper LloydKerry Judi Jaffe, RN VAST

## 2017-06-15 NOTE — H&P (Signed)
[employer] - [place] at [facility]   PATIENT NAME: Bill [name]    MR#:  [id number]  DATE OF BIRTH:  [birth year]  DATE OF ADMISSION:  06/15/2017  PRIMARY CARE PHYSICIAN: Patient, No Pcp Per   REQUESTING/REFERRING PHYSICIAN: Myrna Blazer, MD  CHIEF COMPLAINT:   Chief Complaint  Patient presents with  . Abdominal Pain   Abdominal pain distention HISTORY OF PRESENT ILLNESS:  Bill [name]  is a 76 y.o. male with a known history of sigmoid volvulus, hypertension, pulmonary nodule, cardiac arrest and hypokalemia.  The patient is sent from skilled nursing facility to the ED due to above chief complaints.  The patient is nonverbal and noncommunicative, he is bedbound for caregiver.  Per caregiver and his wife, the patient was found abdominal pain and distention in the nursing home. x-ray was done of his abdomen suspicious for obstruction and volvulus.  ED physician discussed case with Dr. Excell Seltzer of surgery who says to contact GI. PAST MEDICAL HISTORY:   Past Medical History:  Diagnosis Date  . Aphagia   . HTN (hypertension)   . Sigmoid volvulus (HCC)   . Stroke [facility]) 2002   completely dependent with ADLs/IADLs    PAST SURGICAL HISTORY:  History reviewed. No pertinent surgical history.  SOCIAL HISTORY:   Social History   Tobacco Use  . Smoking status: Never Smoker  . Smokeless tobacco: Never Used  Substance Use Topics  . Alcohol use: No    FAMILY HISTORY:   Family History  Problem Relation Age of Onset  . Stroke Father   . Prostate cancer Father     DRUG ALLERGIES:  No Known Allergies  REVIEW OF SYSTEMS:   Review of Systems  Unable to perform ROS: Other    MEDICATIONS AT HOME:   Prior to Admission medications   Medication Sig Start Date End Date Taking? Authorizing Provider  cholecalciferol (VITAMIN D) 1000 units tablet Take 2,000 Units by mouth daily.   Yes [provider]  acetaminophen (TYLENOL) 325 MG tablet Take  650 mg by mouth every 6 (six) hours as needed.    [provider]  aspirin 81 MG chewable tablet Chew 81 mg by mouth daily. Crush tablet    [provider]  ondansetron (ZOFRAN) 4 MG tablet Take 1 tablet (4 mg total) by mouth every 6 (six) hours as needed for nausea. Patient not taking: Reported on 02/01/2017 04/23/16   Richarda Overlie, MD  spironolactone (ALDACTONE) 25 MG tablet Take 12.5 mg by mouth daily.    [provider]      VITAL SIGNS:  Blood pressure 128/77, pulse 78, temperature 98.7 F (37.1 C), temperature source Oral, resp. rate 20, weight 109 lb (49.4 kg), SpO2 97 %.  PHYSICAL EXAMINATION:  Physical Exam  GENERAL:  76 y.o.-year-old patient lying in the bed with no acute distress.  Nonverbal. EYES: Pupils equal, round, reactive to light and accommodation. No scleral icterus. Extraocular muscles intact.  HEENT: Head atraumatic, normocephalic. Oropharynx and nasopharynx clear.  Dry oral mucosa. NECK:  Supple, no jugular venous distention. No thyroid enlargement, no tenderness.  LUNGS: Normal breath sounds bilaterally, no wheezing, rales,rhonchi or crepitation. No use of accessory muscles of respiration.  CARDIOVASCULAR: S1, S2 normal. No murmurs, rubs, or gallops.  ABDOMEN: Soft, nontender, nondistended. Bowel sounds present. No organomegaly or mass.  EXTREMITIES: No pedal edema, cyanosis, or clubbing.  NEUROLOGIC: Unable to exam. PSYCHIATRIC: The patient is awake but nonverbal.  SKIN: No obvious rash, lesion, or  ulcer.   LABORATORY PANEL:   CBC Recent Labs  Lab 06/15/17 1625  WBC 13.5*  HGB 14.0  HCT 41.6  PLT 187   ------------------------------------------------------------------------------------------------------------------  Chemistries  Recent Labs  Lab 06/15/17 1625  NA 146*  K 2.6*  CL 104  CO2 27  GLUCOSE 84  BUN 17  CREATININE 0.57*  CALCIUM 9.0  AST 24  ALT 14*  ALKPHOS 75  BILITOT 1.3*    ------------------------------------------------------------------------------------------------------------------  Cardiac Enzymes No results for input(s): TROPONINI in the last 168 hours. ------------------------------------------------------------------------------------------------------------------  RADIOLOGY:  Ct Abdomen Pelvis Wo Contrast  Result Date: 06/15/2017 CLINICAL DATA:  Abdominal pain and distention question bowel obstruction, past history of bowel obstruction and stroke ; patient nonverbal, unable to provide additional history EXAM: CT ABDOMEN AND PELVIS WITHOUT CONTRAST TECHNIQUE: Multidetector CT imaging of the abdomen and pelvis was performed following the standard protocol without IV contrast. Sagittal and coronal MPR images reconstructed from axial data set. Oral contrast was not administered. COMPARISON:  04/13/2016 FINDINGS: Lower chest: Lung bases appear hyperinflated with subsegmental atelectasis at LEFT lower lobe. Calcified plaque RIGHT lower chest. Hepatobiliary: The liver and gallbladder normal appearance Pancreas: Normal appearance Spleen: Normal appearance Adrenals/Urinary Tract: Minimal adrenal thickening without discrete mass. Kidneys, ureters and bladder normal appearance Stomach/Bowel: Normal appendix. Small hiatal hernia. Stomach and small bowel loops otherwise unremarkable. Gaseous distension of ascending colon containing small amount stool, overall 10.4 cm maximal diameter. Markedly distended sigmoid loop up to 11.0 cm diameter associated with swirling of the sigmoid colon and sigmoid mesocolon compatible with sigmoid volvulus, demonstrated on both axial and coronal imaging. Mild distention of the rectum by fluid. Transverse colon distended by gas up to 7 cm diameter. No bowel wall thickening or evidence of perforation. Vascular/Lymphatic: Scattered atherosclerotic calcifications of coronary arteries, aorta, and iliac arteries. Atherosclerotic calcifications at  origin of LEFT renal artery. Aorta normal caliber. No definite adenopathy. Reproductive: Mild prostatic enlargement. Other: Minimal perihepatic free fluid.  No free air.  No hernia. Musculoskeletal: Diffuse osseous demineralization. IMPRESSION: Recurrent sigmoid volvulus with significant gaseous distention of the sigmoid loop up to 11 cm diameter. No evidence of perforation. Small hiatal hernia. Aortic Atherosclerosis (ICD10-I70.0). Findings called to Dr.  Pershing ProudSchaevitz on 06/15/2017 at 1740 hours. Electronically Signed   By: Ulyses SouthwardMark  Boles M.D.   On: 06/15/2017 17:41      IMPRESSION AND PLAN:   Sigmoid volvulus The patient will be admitted to medical floor.  N.p.o. with IV fluid support.  Per GI Dr.  Dr. Dutch Grayalliani, colonoscopy today.   Hypokalemia.  Give IV potassium, follow-up potassium and magnesium level.  Leukocytosis.  Possible due to reaction.  Follow-up urinalysis.  History of stroke.  Hold aspirin for now.  Discussed with Dr. Dutch Grayalliani  All the records are reviewed and case discussed with ED provider. Management plans discussed with the patient's wife and they are in agreement.  CODE STATUS: Full code  TOTAL TIME TAKING CARE OF THIS PATIENT: 56 minutes.    Shaune Pollackhen, Braydan Marriott M.D on 06/15/2017 at 6:18 PM  Between 7am to 6pm - Pager - [phone]  After 6pm go to [email or web address] - Social research officer, [email or web address] [email or web address]  [employer] [place] [facility]  Office  [phone]  CC: Primary care physician; Patient, No Pcp Per   Note: This dictation was prepared with Dragon dictation along with smaller phrase technology. Any transcriptional errors that result from this process are unin

## 2017-06-15 NOTE — ED Provider Notes (Signed)
[facility] Emergency Department Provider Note  ____________________________________________   First MD Initiated Contact with Patient 06/15/17 1655     (approximate)  I have reviewed the triage vital signs and the nursing notes.   HISTORY  Chief Complaint Abdominal Pain   HPI Bill [name] is a 76 y.o. male with a history of hypertension and stroke as well as sigmoid volvulus who is nonverbal who is presenting to the emergency department abdominal distention and discomfort.  He was sent in by the [facility] after an x-ray was done of his abdomen suspicious for obstruction and volvulus.  I am unable to obtain further information from the patient as he is nonverbal.    Past Medical History:  Diagnosis Date  . HTN (hypertension)   . Stroke [facility](HCC) 2002   completely dependent with ADLs/IADLs    Patient Active Problem List   Diagnosis Date Noted  . Cardiac arrest (HCC) 02/01/2017  . HTN (hypertension) 02/01/2017  . 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 H/O: CVA (cerebrovascular accident) 04/13/2016  . Pulmonary nodule 04/13/2016  . Hypokalemia 04/13/2016    History reviewed. No pertinent surgical history.  Prior to Admission medications   Medication Sig Start Date End Date Taking? Authorizing Provider  acetaminophen (TYLENOL) 325 MG tablet Take 650 mg by mouth every 6 (six) hours as needed.    [provider]  aspirin 81 MG chewable tablet Chew 81 mg by mouth daily. Crush tablet    [provider]  cholecalciferol (VITAMIN D) 1000 units tablet Take 2,000 Units by mouth daily.    [provider]  ondansetron (ZOFRAN) 4 MG tablet Take 1 tablet (4 mg total) by mouth every 6 (six) hours as needed for nausea. Patient not taking: Reported on 02/01/2017 04/23/16   Richarda OverlieAbrol, Nayana, MD  spironolactone (ALDACTONE) 25 MG tablet Take 12.5 mg by mouth daily.    [provider]    Allergies Patient  has no known allergies.  No family history on file.  Social History Social History   Tobacco Use  . Smoking status: Never Smoker  . Smokeless tobacco: Never Used  Substance Use Topics  . Alcohol use: No  . Drug use: No    Review of Systems  Level 5 caveat because of patient being nonverbal. ____________________________________________   PHYSICAL EXAM:  VITAL SIGNS: ED Triage Vitals  Enc Vitals Group     BP 06/15/17 1506 128/77     Pulse Rate 06/15/17 1506 78     Resp 06/15/17 1506 20     Temp 06/15/17 1506 98.7 F (37.1 C)     Temp Source 06/15/17 1506 Oral     SpO2 06/15/17 1506 97 %     Weight 06/15/17 1514 109 lb (49.4 kg)     Height --      Head Circumference --      Peak Flow --      Pain Score --      Pain Loc --      Pain Edu? --      Excl. in GC? --     Constitutional: Alert  Well appearing and in no acute distress.  Sitting in wheelchair.  Nonverbal. Eyes: Conjunctivae are normal.  Head: Atraumatic. Nose: No congestion/rhinnorhea. Mouth/Throat: Mucous membranes are moist.  Neck: No stridor.   Cardiovascular: Normal rate, regular rhythm. Grossly normal heart sounds.  Respiratory: Normal respiratory effort.  No retractions. Lungs CTAB. Gastrointestinal: Soft but with distention especially to  the upper abdomen.  No discernible tenderness as the patient smiles at me when I palpate his abdomen. Musculoskeletal: No lower extremity tenderness nor edema.  No joint effusions. Neurologic:  Normal speech and language. No gross focal neurologic deficits are appreciated. Skin:  Skin is warm, dry and intact. No rash noted. Psychiatric: Mood and affect are normal. Speech and behavior are normal.  ____________________________________________   LABS (all labs ordered are listed, but only abnormal results are displayed)  Labs Reviewed  COMPREHENSIVE METABOLIC PANEL - Abnormal; Notable for the following components:      Result Value   Sodium 146 (*)     Potassium 2.6 (*)    Creatinine, Ser 0.57 (*)    ALT 14 (*)    Total Bilirubin 1.3 (*)    All other components within normal limits  CBC - Abnormal; Notable for the following components:   WBC 13.5 (*)    All other components within normal limits  LIPASE, BLOOD  URINALYSIS, COMPLETE (UACMP) WITH MICROSCOPIC   ____________________________________________  EKG   ____________________________________________  RADIOLOGY  Sigmoid volvulus found on CAT scan of the abdomen and pelvis. ____________________________________________   PROCEDURES  Procedure(s) performed:   Procedures  Critical Care performed:   ____________________________________________   INITIAL IMPRESSION / ASSESSMENT AND PLAN / ED COURSE  Pertinent labs & imaging results that were available during my care of the patient were reviewed by me and considered in my medical decision making (see chart for details).  Differential diagnosis includes, but is not limited to, acute appendicitis, renal colic, testicular torsion, urinary tract infection/pyelonephritis, prostatitis,  epididymitis, diverticulitis, small bowel obstruction or ileus, colitis, abdominal aortic aneurysm, gastroenteritis, hernia, etc.  As part of my medical decision making, I reviewed the following data within the electronic MEDICAL RECORD NUMBERPatient presents with a most form.  The patient appears to be a full code.  On aspirin but otherwise no blood thinners.     ----------------------------------------- 6:02 PM on 06/15/2017 -----------------------------------------  I discussed case with Dr. Excell Seltzer of surgery who says to contact GI.  I then discussed the case with Dr. Dutch Gray of gastroenterology.  She agrees to see the patient is a Research scientist (medical).  The patient will be admitted to the hospitalist service and was signed out to Dr. Imogene Burn.  Patient's wife is at the bedside and is aware of the diagnosis as well as the treatment plan.  Patient also with  hypokalemia.  We will replete via IV.  ____________________________________________   FINAL CLINICAL IMPRESSION(S) / ED DIAGNOSES  Sigmoid volvulus.  Hypokalemia.    NEW MEDICATIONS STARTED DURING THIS VISIT:  This SmartLink is deprecated. Use AVSMEDLIST instead to display the medication list for a patient.   Note:  This document was prepared using Dragon voice recognition software and may include unintentional dictation errors.     Myrna Blazer, MD 06/15/17 [id number]

## 2017-06-15 NOTE — ED Notes (Signed)
Patient transported to CT 

## 2017-06-15 NOTE — ED Triage Notes (Signed)
Pt is nonverbal from [facility], pt sent from facility with abdominal pain/distention,, R/O bowel obstruction, spoke to patients guardian haze; [place], consent obtained for care, unable to assess abdomen in triage d/t pt in wheelchair, orders obtained

## 2017-06-15 NOTE — Anesthesia Procedure Notes (Signed)
Date/Time: 06/15/2017 7:49 PM Performed by: Junious SilkNoles, Aiko Belko, CRNA Pre-anesthesia Checklist: Patient identified, Emergency Drugs available, Suction available, Patient being monitored and Timeout performed Oxygen Delivery Method: Nasal cannula

## 2017-06-15 NOTE — ED Notes (Signed)
Skin turgor poor, unable to start line after two attempts, order placed for IV team

## 2017-06-15 NOTE — Anesthesia Postprocedure Evaluation (Signed)
Anesthesia Post Note  Patient: Bill LentoWilliam M [name]  Procedure(s) Performed: FLEXIBLE SIGMOIDOSCOPY (N/A )  Patient location during evaluation: PACU Anesthesia Type: General Level of consciousness: awake and alert Pain management: pain level controlled Vital Signs Assessment: post-procedure vital signs reviewed and stable Respiratory status: spontaneous breathing and respiratory function stable Cardiovascular status: stable Anesthetic complications: no     Last Vitals:  Vitals:   06/15/17 2119 06/15/17 2126  BP: (!) 118/53   Pulse: 79   Resp: 15   Temp:  36.5 C  SpO2:      Last Pain:  Vitals:   06/15/17 2126  TempSrc: Oral                 Markeis Allman,Kuzey K

## 2017-06-15 NOTE — Anesthesia Post-op Follow-up Note (Signed)
Anesthesia QCDR form completed.        

## 2017-06-15 NOTE — Op Note (Signed)
[facility] Gastroenterology Patient Name: Bill [name] Procedure Date: 06/15/2017 7:15 PM MRN: [id number] Account #: [id number] Date of Birth: 09/27/1940 Admit Type: Outpatient Age: 76 Room: [facility] ENDO ROOM 4 Gender: Male Note Status: Finalized Procedure:            Flexible Sigmoidoscopy Indications:          Volvulus, For therapy of volvulus Providers:            Varnita B. Maximino Greenland MD, MD Referring MD:         Sallye Lat Md, MD (Referring MD) Medicines:            Monitored Anesthesia Care Complications:        No immediate complications. Procedure:            Pre-Anesthesia Assessment:                       - ASA Grade Assessment: III - A patient with severe                        systemic disease.                       - After reviewing the risks and benefits, the patient                        was deemed in satisfactory condition to undergo the                        procedure.                       - The risks and benefits of the procedure and the                        sedation options and risks were discussed with the                        patient. All questions were answered and informed                        consent was obtained.                       After obtaining informed consent, the scope was passed                        under direct vision. The Endoscope was introduced                        through the anus and advanced to the the sigmoid colon.                        The flexible sigmoidoscopy was accomplished with ease.                        The patient tolerated the procedure well. The quality                        of the bowel preparation was adequate. Findings:      The perianal and digital rectal examinations were normal.  A suspected volvulus, with viable appearing mucosa, was found in the       sigmoid colon. Decompression of the volvulus was attempted and was       successful, with complete decompression achieved. Upon  insertion of the       scope, a distinct area of colon dilation was evident in the proximal       sigmoid colon (See image). The scope was able to be advanced beyond this       point, all the way to the transverse colon for decompression. Solid       stool prevented further advancement past the transverse colon. The colon       was decompressed as the scope was withdrawn. Impression:           - Suspected volvulus. Successful complete decompression                        achieved.                       - No specimens collected. Recommendation:       - Continue present medications.                       - Transport patient to ER.                       - Clear liquid diet today.                       - Obtain repeat imaging at this time to evaluate any                        residual colonic distension.                       - Surgery consult to evaluate for sigmoid resection due                        to recurrent volvulus.                       Maintain soft stools with miralax daily                       Avoid constipating meds                       Encourage ambulation as tolerated.                       Above discussed with Dr. Imogene Burn and he will be ordering                        Abdominal Xray and surgery consult Procedure Code(s):    --- Professional ---                       [phone], Sigmoidoscopy, flexible; with decompression (for                        pathologic distention) (eg, volvulus, megacolon),                        including  placement of decompression tube, when                        performed Diagnosis Code(s):    --- Professional ---                       K56.2, Volvulus CPT copyright 2016 American Medical Association. All rights reserved. The codes documented in this report are preliminary and upon coder review may  be revised to meet current compliance requirements.  Melodie BouillonVarnita Tahiliani, MD Michel BickersVarnita B. Maximino Greenlandahiliani MD, MD 06/15/2017 8:32:30 PM This report has been signed  electronically. Number of Addenda: 0 Note Initiated On: 06/15/2017 7:15 PM Total Procedure Duration: 0 hours 19 minutes 19 seconds       [facility]

## 2017-06-15 NOTE — Transfer of Care (Signed)
Immediate Anesthesia Transfer of Care Note  Patient: Iva LentoWilliam M Yeh  Procedure(s) Performed: FLEXIBLE SIGMOIDOSCOPY (N/A )  Patient Location: PACU  Anesthesia Type:General  Level of Consciousness: sedated  Airway & Oxygen Therapy: Patient Spontanous Breathing and Patient connected to nasal cannula oxygen  Post-op Assessment: Report given to RN and Post -op Vital signs reviewed and stable  Post vital signs: Reviewed and stable  Last Vitals:  Vitals:   06/15/17 1830 06/15/17 2013  BP: (!) 94/32 (!) (P) 96/52  Pulse:  (P) 85  Resp:  (P) 14  Temp:  (P) 36.6 C  SpO2:  (P) 99%    Last Pain:  Vitals:   06/15/17 1506  TempSrc: Oral         Complications: No apparent anesthesia complications

## 2017-06-15 NOTE — Progress Notes (Addendum)
Please see Flex-Sig report under Procedures. Pt. Tolerated procedure well. Colon distension was noted and colon was decompressed successfully. Patient's abdomen was evaluated post procedure and was soft and not distended. An Abdominal Xray was ordered to compare post procedure images to preprocedure volvulus. Findings and recommendations noted in my procedure note and were discussed with primary team Dr. Imogene Burnhen. Dr. Imogene Burnhen to please follow up abdominal xray results as well. Thank you

## 2017-06-15 NOTE — Consult Note (Signed)
Melodie Bouillon, MD [address], [address], [place], [place], [address] [address], [place], [place], [address] Phone: [phone]  Fax: [phone]  Consultation  Referring Provider:     Dr. Imogene Burn Primary Care Physician:  Patient, No Pcp Per Primary Gastroenterologist:  Dr. Maximino Greenland        Reason for Consultation:     Sigmoid volvulus  Date of Admission:  06/15/2017 Date of Consultation:  06/15/2017         HPI:   Bill [name] is a 76 y.o. male presents with abdominal pain of 2 day duration. It is located in the diffuse abdomen, 8/10 in severity, dull in quality. He is non verbal but is able to nod yes or no. Most history obtained from wife, his facility clinic physician and previous documentation. 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 As per his clinic MD family has chosen not have surgery to prevent recurrent volvulus as they were worried about him not doing well with it. He denies N/V. Has not had solid food today as per pt and wife.   Past Medical History:  Diagnosis Date  . HTN (hypertension)   . Stroke [facility]) 2002   completely dependent with ADLs/IADLs    History reviewed. No pertinent surgical history.  Prior to Admission medications   Medication Sig Start Date End Date Taking? Authorizing Provider  cholecalciferol (VITAMIN D) 1000 units tablet Take 2,000 Units by mouth daily.   Yes [provider]  acetaminophen (TYLENOL) 325 MG tablet Take 650 mg by mouth every 6 (six) hours as needed.    [provider]  aspirin 81 MG chewable tablet Chew 81 mg by mouth daily. Crush tablet    [provider]  ondansetron (ZOFRAN) 4 MG tablet Take 1 tablet (4 mg total) by mouth every 6 (six) hours as needed for nausea. Patient not taking: Reported on 02/01/2017 04/23/16   Richarda Overlie, MD  spironolactone (ALDACTONE) 25 MG tablet Take 12.5 mg by mouth daily.    [provider]    No family history on file.   Social  History   Tobacco Use  . Smoking status: Never Smoker  . Smokeless tobacco: Never Used  Substance Use Topics  . Alcohol use: No  . Drug use: No    Allergies as of 06/15/2017  . (No Known Allergies)    Review of Systems:    All systems reviewed and negative except where noted in HPI.   Physical Exam:  Vital signs in last 24 hours: Temp:  [98.7 F (37.1 C)] 98.7 F (37.1 C) (11/05 1506) Pulse Rate:  [78] 78 (11/05 1506) Resp:  [20] 20 (11/05 1506) BP: (128)/(77) 128/77 (11/05 1506) SpO2:  [97 %] 97 % (11/05 1506) Weight:  [49.4 kg (109 lb)] 49.4 kg (109 lb) (11/05 1514)   General:   Pleasant, cooperative in NAD Head:  Normocephalic and atraumatic. Eyes:   No icterus.   Conjunctiva pink. PERRLA. Ears:  Normal auditory acuity. Neck:  Supple; no masses or thyroidomegaly Lungs: Respirations even and unlabored. Lungs clear to auscultation bilaterally.   No wheezes, crackles, or rhonchi.  Heart:  Regular rate and rhythm;  Without murmur, clicks, rubs or gallops Abdomen:  Soft, nondistended, nontender. No bowel sounds. No appreciable masses or hepatomegaly.  No rebound or guarding.  Rectal:  Not performed. Msk:  Symmetrical without gross deformities.   Extremities:  Without edema, cyanosis or clubbing. Neurologic:  Alert and oriented x3;  grossly normal neurologically. Skin:  Intact without significant lesions or rashes. Cervical Nodes:  No significant cervical adenopathy. Psych:  Alert and cooperative. Normal affect.  LAB RESULTS: Recent Labs    06/15/17 1625  WBC 13.5*  HGB 14.0  HCT 41.6  PLT 187   BMET Recent Labs    06/15/17 1625  NA 146*  K 2.6*  CL 104  CO2 27  GLUCOSE 84  BUN 17  CREATININE 0.57*  CALCIUM 9.0   LFT Recent Labs    06/15/17 1625  PROT 7.6  ALBUMIN 4.1  AST 24  ALT 14*  ALKPHOS 75  BILITOT 1.3*   PT/INR No results for input(s): LABPROT, INR in the last 72 hours.  STUDIES: Ct Abdomen Pelvis Wo Contrast  Result Date:  06/15/2017 CLINICAL DATA:  Abdominal pain and distention question bowel obstruction, past history of bowel obstruction and stroke ; patient nonverbal, unable to provide additional history EXAM: CT ABDOMEN AND PELVIS WITHOUT CONTRAST TECHNIQUE: Multidetector CT imaging of the abdomen and pelvis was performed following the standard protocol without IV contrast. Sagittal and coronal MPR images reconstructed from axial data set. Oral contrast was not administered. COMPARISON:  04/13/2016 FINDINGS: Lower chest: Lung bases appear hyperinflated with subsegmental atelectasis at LEFT lower lobe. Calcified plaque RIGHT lower chest. Hepatobiliary: The liver and gallbladder normal appearance Pancreas: Normal appearance Spleen: Normal appearance Adrenals/Urinary Tract: Minimal adrenal thickening without discrete mass. Kidneys, ureters and bladder normal appearance Stomach/Bowel: Normal appendix. Small hiatal hernia. Stomach and small bowel loops otherwise unremarkable. Gaseous distension of ascending colon containing small amount stool, overall 10.4 cm maximal diameter. Markedly distended sigmoid loop up to 11.0 cm diameter associated with swirling of the sigmoid colon and sigmoid mesocolon compatible with sigmoid volvulus, demonstrated on both axial and coronal imaging. Mild distention of the rectum by fluid. Transverse colon distended by gas up to 7 cm diameter. No bowel wall thickening or evidence of perforation. Vascular/Lymphatic: Scattered atherosclerotic calcifications of coronary arteries, aorta, and iliac arteries. Atherosclerotic calcifications at origin of LEFT renal artery. Aorta normal caliber. No definite adenopathy. Reproductive: Mild prostatic enlargement. Other: Minimal perihepatic free fluid.  No free air.  No hernia. Musculoskeletal: Diffuse osseous demineralization. IMPRESSION: Recurrent sigmoid volvulus with significant gaseous distention of the sigmoid loop up to 11 cm diameter. No evidence of perforation.  Small hiatal hernia. Aortic Atherosclerosis (ICD10-I70.0). Findings called to Dr.  Pershing ProudSchaevitz on 06/15/2017 at 1740 hours. Electronically Signed   By: Ulyses SouthwardMark  Boles M.D.   On: 06/15/2017 17:41      Impression / Plan:   Bill [name] is a 76 y.o. y/o male with abdominal pain and sigmoid volvulus on CT with hx of the same a year ago and family elected not to have surgery in the past  Pt and family agree to proceeding with Flex-Sig Will try to schedule in the OR today Pt to remain NPO Please correct electrolyte abnormalities Avoid any exacerbating meds   Thank you for involving me in the care of this patient.      LOS: 0 days   Bill SpillersVarnita B Lynda Wanninger, MD  06/15/2017, 6:18 PM

## 2017-06-15 NOTE — Anesthesia Preprocedure Evaluation (Addendum)
Anesthesia Evaluation  Patient identified by MRN, date of birth, ID band Patient unresponsive    Reviewed: Allergy & Precautions, NPO status , Unable to perform ROS - Chart review only  History of Anesthesia Complications Negative for: history of anesthetic complications  Airway Mallampati: II       Dental  (+) Chipped, Loose, Missing, Poor Dentition   Pulmonary neg sleep apnea, neg COPD,           Cardiovascular hypertension, Pt. on medications + CAD and + Past MI  + dysrhythmias (cardiac arrest)      Neuro/Psych CVA (Right sided weakness, no verbal, can answer with shaking of head), Residual Symptoms    GI/Hepatic Neg liver ROS, neg GERD  ,  Endo/Other  neg diabetes  Renal/GU negative Renal ROS     Musculoskeletal   Abdominal   Peds  Hematology   Anesthesia Other Findings   Reproductive/Obstetrics                           Anesthesia Physical Anesthesia Plan  ASA: III and emergent  Anesthesia Plan: General   Post-op Pain Management:    Induction: Intravenous  PONV Risk Score and Plan: 2  Airway Management Planned: Nasal Cannula  Additional Equipment:   Intra-op Plan:   Post-operative Plan:   Informed Consent: I have reviewed the patients History and Physical, chart, labs and discussed the procedure including the risks, benefits and alternatives for the proposed anesthesia with the patient or authorized representative who has indicated his/her understanding and acceptance.     Plan Discussed with:   Anesthesia Plan Comments:         Anesthesia Quick Evaluation

## 2017-06-16 ENCOUNTER — Other Ambulatory Visit: Payer: Self-pay

## 2017-06-16 ENCOUNTER — Inpatient Hospital Stay: Payer: No Typology Code available for payment source

## 2017-06-16 DIAGNOSIS — K562 Volvulus: Principal | ICD-10-CM

## 2017-06-16 LAB — CBC
HCT: 36.1 % — ABNORMAL LOW (ref 40.0–52.0)
HEMOGLOBIN: 12.2 g/dL — AB (ref 13.0–18.0)
MCH: 31.7 pg (ref 26.0–34.0)
MCHC: 33.6 g/dL (ref 32.0–36.0)
MCV: 94.2 fL (ref 80.0–100.0)
Platelets: 146 10*3/uL — ABNORMAL LOW (ref 150–440)
RBC: 3.84 MIL/uL — AB (ref 4.40–5.90)
RDW: 14.2 % (ref 11.5–14.5)
WBC: 9.7 10*3/uL (ref 3.8–10.6)

## 2017-06-16 LAB — BASIC METABOLIC PANEL
ANION GAP: 10 (ref 5–15)
BUN: 17 mg/dL (ref 6–20)
CALCIUM: 8.2 mg/dL — AB (ref 8.9–10.3)
CHLORIDE: 107 mmol/L (ref 101–111)
CO2: 28 mmol/L (ref 22–32)
Creatinine, Ser: 0.45 mg/dL — ABNORMAL LOW (ref 0.61–1.24)
GFR calc non Af Amer: >60 mL/min (ref >60)
GLUCOSE: 70 mg/dL (ref 65–99)
Potassium: 2.8 mmol/L — ABNORMAL LOW (ref 3.5–5.1)
Sodium: 145 mmol/L (ref 135–145)

## 2017-06-16 LAB — POTASSIUM: Potassium: 2.9 mmol/L — ABNORMAL LOW (ref 3.5–5.1)

## 2017-06-16 MED ORDER — POTASSIUM CHLORIDE 10 MEQ/100ML IV SOLN
10.0000 meq | INTRAVENOUS | Status: AC
  Administered 2017-06-16 – 2017-06-17 (×4): 10 meq via INTRAVENOUS
  Filled 2017-06-16 (×2): qty 100

## 2017-06-16 NOTE — Progress Notes (Signed)
[facility] [place] [facility]   PATIENT NAME: Bill [name]    MR#:  [id number]  DATE OF BIRTH:  04/03/1941  SUBJECTIVE:  Non verbal due to CVA  REVIEW OF SYSTEMS:   Review of Systems  Unable to perform ROS: Language   Tolerating PT: bed bound  DRUG ALLERGIES:  No Known Allergies  VITALS:  Blood pressure 128/70, pulse 74, temperature 98.1 F (36.7 C), temperature source Oral, resp. rate 12, weight 48.1 kg (106 lb), SpO2 100 %.  PHYSICAL EXAMINATION:   Physical Exam limited exam  GENERAL:  76 y.o.-year-old patient lying in the bed with no acute distress. Thin cachectic EYES: Pupils equal, round, reactive to light and accommodation. No scleral icterus. Extraocular muscles intact.  HEENT: Head atraumatic, normocephalic. Oropharynx and nasopharynx clear.  NECK:  Supple, no jugular venous distention. No thyroid enlargement, no tenderness.  LUNGS: Normal breath sounds bilaterally, no wheezing, rales, rhonchi. No use of accessory muscles of respiration.  CARDIOVASCULAR: S1, S2 normal. No murmurs, rubs, or gallops.  ABDOMEN: Soft, nontender, nondistended. Bowel sounds present. No organomegaly or mass.  EXTREMITIES: No cyanosis, clubbing or edema b/l.    NEUROLOGIC: aphasia chronic weakness both LE PSYCHIATRIC:  patient is alert but nonverbal SKIN: No obvious rash, lesion, or ulcer.   LABORATORY PANEL:  CBC Recent Labs  Lab 06/16/17 0332  WBC 9.7  HGB 12.2*  HCT 36.1*  PLT 146*    Chemistries  Recent Labs  Lab 06/15/17 1625 06/16/17 0332  NA 146* 145  K 2.6* 2.8*  CL 104 107  CO2 27 28  GLUCOSE 84 70  BUN 17 17  CREATININE 0.57* 0.45*  CALCIUM 9.0 8.2*  MG 1.9  --   AST 24  --   ALT 14*  --   ALKPHOS 75  --   BILITOT 1.3*  --    Cardiac Enzymes No results for input(s): TROPONINI in the last 168 hours. RADIOLOGY:  Ct Abdomen Pelvis Wo Contrast  Result Date: 06/15/2017 CLINICAL DATA:  Abdominal pain and distention question  bowel obstruction, past history of bowel obstruction and stroke ; patient nonverbal, unable to provide additional history EXAM: CT ABDOMEN AND PELVIS WITHOUT CONTRAST TECHNIQUE: Multidetector CT imaging of the abdomen and pelvis was performed following the standard protocol without IV contrast. Sagittal and coronal MPR images reconstructed from axial data set. Oral contrast was not administered. COMPARISON:  04/13/2016 FINDINGS: Lower chest: Lung bases appear hyperinflated with subsegmental atelectasis at LEFT lower lobe. Calcified plaque RIGHT lower chest. Hepatobiliary: The liver and gallbladder normal appearance Pancreas: Normal appearance Spleen: Normal appearance Adrenals/Urinary Tract: Minimal adrenal thickening without discrete mass. Kidneys, ureters and bladder normal appearance Stomach/Bowel: Normal appendix. Small hiatal hernia. Stomach and small bowel loops otherwise unremarkable. Gaseous distension of ascending colon containing small amount stool, overall 10.4 cm maximal diameter. Markedly distended sigmoid loop up to 11.0 cm diameter associated with swirling of the sigmoid colon and sigmoid mesocolon compatible with sigmoid volvulus, demonstrated on both axial and coronal imaging. Mild distention of the rectum by fluid. Transverse colon distended by gas up to 7 cm diameter. No bowel wall thickening or evidence of perforation. Vascular/Lymphatic: Scattered atherosclerotic calcifications of coronary arteries, aorta, and iliac arteries. Atherosclerotic calcifications at origin of LEFT renal artery. Aorta normal caliber. No definite adenopathy. Reproductive: Mild prostatic enlargement. Other: Minimal perihepatic free fluid.  No free air.  No hernia. Musculoskeletal: Diffuse osseous demineralization. IMPRESSION: Recurrent sigmoid volvulus with significant gaseous distention of the sigmoid loop  up to 11 cm diameter. No evidence of perforation. Small hiatal hernia. Aortic Atherosclerosis (ICD10-I70.0).  Findings called to Dr.  Pershing ProudSchaevitz on 06/15/2017 at 1740 hours. Electronically Signed   By: Ulyses SouthwardMark  Boles M.D.   On: 06/15/2017 17:41   Dg Abd 1 View  Result Date: 06/16/2017 CLINICAL DATA:  Abdominal distention.  Recent sigmoid volvulus EXAM: ABDOMEN - 1 VIEW COMPARISON:  CT abdomen and pelvis June 15, 2017 FINDINGS: There remains distention of the sigmoid colon with much of the sigmoid colon present in the upper abdomen. The diameter of the sigmoid colon measures up to 9.6 cm, slightly less than on CT 1 day prior. There is no other bowel distention. There is moderate stool in the right colon. No free air evident. No air-fluid levels. IMPRESSION: There remains distention of the sigmoid colon with sigmoid colon in the upper abdomen. There is concern for persistence of a degree of sigmoid volvulus. No free air or intramural air seen. No other bowel dilatation. No air-fluid levels. The overall degree of distention of the sigmoid colon is slightly less than on study from 1 day prior. These results will be called to the ordering clinician or representative by the Radiologist Assistant, and communication documented in the PACS or zVision Dashboard. Electronically Signed   By: Bretta BangWilliam  Woodruff III M.D.   On: 06/16/2017 10:38   ASSESSMENT AND PLAN:  Bill [name]  is a 76 y.o. male with a known history of sigmoid volvulus, hypertension, pulmonary nodule, cardiac arrest and hypokalemia.  The patient is sent from skilled nursing facility to the ED due to above chief complaints.  The patient is nonverbal and noncommunicative, he is bedbound for caregiver.   1.  Sigmoid volvulus-recurrent -Patient underwent emergent GI decompression yesterday. -Started on liquid diet.  X-ray abdomen today shows sigmoid volvulus 9.6 cm -GI recommends patient be n.p.o. -Surgical consultation made.  Discussed with Dr. Excell Seltzerooper. -We will need to discuss with daughter [name] healthcare power of attorney [phone] to see if she  wants a surgical option if he does not improve with conservative measures. -Continue IV  2.  History of CVA -Patient is bedbound, total care, aphasic -He is from [facility]  3.  Hypokalemia -IV KCl replacement -Pharmacy consult  4.  DVT prophylaxis Subcu heparin  Spoke with daughter Zella RicherHazel Moree over the phone and updated. Case discussed with Care Management/Social Worker. Management plans discussed with the patient, family and they are in agreement.  CODE STATUS: full  DVT Prophylaxis: heparin  TOTAL TIME TAKING CARE OF THIS PATIENT: *30* minutes.  >50% time spent on counselling and coordination of care  POSSIBLE D/C IN 1-2 DAYS, DEPENDING ON CLINICAL CONDITION.  Note: This dictation was prepared with Dragon dictation along with smaller phrase technology. Any transcriptional errors that result from this process are unintentional.  Kolin Erdahl M.D on 06/16/2017 at 2:29 PM  Between 7am to 6pm - Pager - [phone]  After 6pm go to [email or web address] - password [email or web address]  [facility]  Office  [phone]  CC: Primary care physician; System, Pcp Not In

## 2017-06-16 NOTE — Progress Notes (Signed)
Discussed with Dr. Allena KatzPatel. Visited patient and he shakes his head that he has no abdominal pain. Abdominal exam is unchanged and nondistended.  Will attempt to discuss with family how aggressive they want to be if this volvulus recurs. Films are ordered for tomorrow.

## 2017-06-16 NOTE — Clinical Social Work Note (Signed)
Clinical Social Work Assessment  Patient Details  Name: Bill LentoWilliam M [name] MRN: [id number] Date of Birth: 12/21/1940  Date of referral:  06/16/17               Reason for consult:  Discharge Planning                Permission sought to share information with:    Permission granted to share information::     Name::        Agency::     Relationship::     Contact Information:     Housing/Transportation Living arrangements for the past 2 months:  Skilled Nursing Facility Source of Information:  Adult Children Patient Interpreter Needed:  None Criminal Activity/Legal Involvement Pertinent to Current Situation/Hospitalization:  No - Comment as needed Significant Relationships:  Adult Children Lives with:  Facility Resident Do you feel safe going back to the place where you live?  Yes Need for family participation in patient care:  Yes (Comment)  Care giving concerns:  [facility].   Social Worker assessment / plan:  CSW is unable to converse with patient as he is non-verbal from a stroke. CSW was contacted by Marylou FlesherKeta with [facility] and informed that patient's HCPOA and POA is his daughter: Bill [name]: [phone] and that she would bring the paperwork to us tomorrow.   CSW spoke with patient's daughter, Bill [name], this afternoon and she confirmed that she wishes for patient to [facility] when [facility] and that [facility] will more than likely transport.  Employment status:  Retired Health and safety inspectornsurance information:  Managed Care PT Recommendations:    Information / Referral to community resources:     Patient/Family's Response to care:  Patient's daughter expressed appreciation for CSW assistance.  Patient/Family's Understanding of and Emotional Response to Diagnosis, Current Treatment, and Prognosis:  Patient's daughter is involved with her father's care.  Emotional Assessment Appearance:    Attitude/Demeanor/Rapport:    Affect (typically observed):    Orientation:     Alcohol / Substance use:  Not Applicable Psych involvement (Current and /or in the community):  No (Comment)  Discharge Needs  Concerns to be addressed:  No discharge needs identified Readmission within the last 30 days:  No Current discharge risk:  None Barriers to Discharge:  No Barriers Identified   York SpanielMonica Meira Wahba, LCSW 06/16/2017, 4:34 PM

## 2017-06-16 NOTE — Progress Notes (Signed)
Melodie BouillonVarnita Tahiliani, MD [address], [address], [place], [place], [address] [address], [place], [place], [address] Phone: [phone]  Fax: [phone]   Iva LentoWilliam M Roderick is being followed for Sigmoid Volvulus Day 2 of follow up   Subjective: Pt sitting up in bed and tolerating PO diet without pain, Nausea or vomiting   Objective: Vital signs in last 24 hours: Vitals:   06/15/17 2119 06/15/17 2126 06/16/17 0420 06/16/17 1250  BP: (!) 118/53  (!) 110/52 128/70  Pulse: 79  76 74  Resp: 15  14 12   Temp:  97.7 F (36.5 C) 98.6 F (37 C) 98.1 F (36.7 C)  TempSrc:  Oral  Oral  SpO2:    100%  Weight:  49.2 kg (108 lb 8 oz) 48.1 kg (106 lb)    Weight change:   Intake/Output Summary (Last 24 hours) at 06/16/2017 1319 Last data filed at 06/16/2017 1155 Gross per 24 hour  Intake 1086.2 ml  Output 0 ml  Net 1086.2 ml     Exam: Heart:: Regular rate and rhythm, No edema, +S1, +S2 Lungs: normal and clear to auscultation Abdomen: soft, nontender, normal bowel sounds   Lab Results: @LABTEST2 @ Micro Results: No results found for this or any previous visit (from the past 240 hour(s)). Studies/Results: Ct Abdomen Pelvis Wo Contrast  Result Date: 06/15/2017 CLINICAL DATA:  Abdominal pain and distention question bowel obstruction, past history of bowel obstruction and stroke ; patient nonverbal, unable to provide additional history EXAM: CT ABDOMEN AND PELVIS WITHOUT CONTRAST TECHNIQUE: Multidetector CT imaging of the abdomen and pelvis was performed following the standard protocol without IV contrast. Sagittal and coronal MPR images reconstructed from axial data set. Oral contrast was not administered. COMPARISON:  04/13/2016 FINDINGS: Lower chest: Lung bases appear hyperinflated with subsegmental atelectasis at LEFT lower lobe. Calcified plaque RIGHT lower chest. Hepatobiliary: The liver and gallbladder normal appearance Pancreas: Normal appearance Spleen: Normal  appearance Adrenals/Urinary Tract: Minimal adrenal thickening without discrete mass. Kidneys, ureters and bladder normal appearance Stomach/Bowel: Normal appendix. Small hiatal hernia. Stomach and small bowel loops otherwise unremarkable. Gaseous distension of ascending colon containing small amount stool, overall 10.4 cm maximal diameter. Markedly distended sigmoid loop up to 11.0 cm diameter associated with swirling of the sigmoid colon and sigmoid mesocolon compatible with sigmoid volvulus, demonstrated on both axial and coronal imaging. Mild distention of the rectum by fluid. Transverse colon distended by gas up to 7 cm diameter. No bowel wall thickening or evidence of perforation. Vascular/Lymphatic: Scattered atherosclerotic calcifications of coronary arteries, aorta, and iliac arteries. Atherosclerotic calcifications at origin of LEFT renal artery. Aorta normal caliber. No definite adenopathy. Reproductive: Mild prostatic enlargement. Other: Minimal perihepatic free fluid.  No free air.  No hernia. Musculoskeletal: Diffuse osseous demineralization. IMPRESSION: Recurrent sigmoid volvulus with significant gaseous distention of the sigmoid loop up to 11 cm diameter. No evidence of perforation. Small hiatal hernia. Aortic Atherosclerosis (ICD10-I70.0). Findings called to Dr.  Pershing ProudSchaevitz on 06/15/2017 at 1740 hours. Electronically Signed   By: Ulyses SouthwardMark  Boles M.D.   On: 06/15/2017 17:41   Dg Abd 1 View  Result Date: 06/16/2017 CLINICAL DATA:  Abdominal distention.  Recent sigmoid volvulus EXAM: ABDOMEN - 1 VIEW COMPARISON:  CT abdomen and pelvis June 15, 2017 FINDINGS: There remains distention of the sigmoid colon with much of the sigmoid colon present in the upper abdomen. The diameter of the sigmoid colon measures up to 9.6 cm, slightly less than on CT 1 day prior. There is  no other bowel distention. There is moderate stool in the right colon. No free air evident. No air-fluid levels. IMPRESSION: There remains  distention of the sigmoid colon with sigmoid colon in the upper abdomen. There is concern for persistence of a degree of sigmoid volvulus. No free air or intramural air seen. No other bowel dilatation. No air-fluid levels. The overall degree of distention of the sigmoid colon is slightly less than on study from 1 day prior. These results will be called to the ordering clinician or representative by the Radiologist Assistant, and communication documented in the PACS or zVision Dashboard. Electronically Signed   By: Bretta BangWilliam  Woodruff III M.D.   On: 06/16/2017 10:38   Medications: I have reviewed the patient's current medications. Scheduled Meds: . enoxaparin (LOVENOX) injection  40 mg Subcutaneous Q24H   Continuous Infusions: . 0.9 % NaCl with KCl 40 mEq / L 75 mL/hr (06/16/17 0443)   PRN Meds:.acetaminophen **OR** acetaminophen, albuterol, ondansetron **OR** ondansetron (ZOFRAN) IV   Assessment: Active Problems:   Sigmoid volvulus (HCC)    Plan: Abdominal Xray this morning showed colon distension Would discuss this with surgery and consider repeat CT to determine if volvulus has reoccured. If volvulus has reoccured pt will need surgical intervention Pt should be made NPO until further evaluation with repeat imaging or by surgery I discussed this with the patient's nurse as well and she will be contacting Dr. Allena KatzPatel for further orders and plan in regard to the above   LOS: 1 day   Melodie BouillonVarnita Tahiliani, MD 06/16/2017, 1:19 PM

## 2017-06-16 NOTE — Progress Notes (Signed)
Notified Dr. Allena KatzPatel of results from radiology earlier this morning. Per Dr. Allena KatzPatel she was reviewing the results upon my call. Spoke with Dr. Maximino Greenlandahiliani from GI regarding the results of xray and she relayed message to give to Dr. Allena KatzPatel that if volvulus has re-occurred patient would need surgery, this occurred this afternoon. Dr. Allena KatzPatel notified.

## 2017-06-16 NOTE — Clinical Social Work Note (Signed)
CSW received call from PACE provider: Dellia CloudKesha stating that [facility] daughter: Bill [name]: [phone]. CSW has notified patient's nurse. York SpanielMonica Herb Beltre MSW,LCSW [phone]

## 2017-06-16 NOTE — Evaluation (Addendum)
Clinical/Bedside Swallow Evaluation Patient Details  Name: Bill [name] MRN: [id number] Date of Birth: 08/14/1940  Today's Date: 06/16/2017 Time: SLP Start Time (ACUTE ONLY): 1530 SLP Stop Time (ACUTE ONLY): 1630 SLP Time Calculation (min) (ACUTE ONLY): 60 min  Past Medical History:  Past Medical History:  Diagnosis Date  . Aphagia   . HTN (hypertension)   . Sigmoid volvulus (HCC)   . Stroke [facility](HCC) 2002   completely dependent with ADLs/IADLs   Past Surgical History: History reviewed. No pertinent surgical history. HPI:  Pt is a 76 y.o. male with a known history of sigmoid volvulus, hypertension, pulmonary nodule, cardiac arrest, CVAs, hypokalemia. The patient is sent from skilled nursing facility to the ED. The patient is nonverbal and noncommunicative, he is bedbound per caregiver. Per caregiver and his wife, the patient was found abdominal pain and distention in the nursing home. x-ray was done of his abdomen suspicious for obstruction and volvulus(pt has had a similar issue in the past). ED physician discussed case with Dr. Excell Seltzerooper of surgery, GI; pt underwent a decompressive sigmoidoscopy for sigmoid volvulus. In 2014, pt was [facility]. Pt had hx of CVA years prior w/ resulting dysphagia and aphasia. He reported trouble swallowing, trouble swallowing pills, and did have a h/o pneumonia dx. A MBSS was performed w/ dx'd Oropharyngeal phase dysphagia and recommendations for a Pureed diet w/ Thin liquids w/ Strict Aspiration precautions - increased risk for aspiration, per report then. Pt lives with his wife, who takes care of him along with home health aides (per his daughter Carollee HerterShannon) and is apparently debilitated at baseline due to multiple strokes. 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 He apparently eats a  puree/mashed soft diet w/ thin liquids which family members feed to him as he is unable to feed himself.   Assessment / Plan / Recommendation Clinical Impression  Pt appears to present w/ moderate-severe oropharyngeal dysphagia w/ moderate risk for aspiration as well as risk for inability to fully meet nutritional needs adequately(family member reported pt is on a somewhat limited diet of foods/liquids at home). Pt's past med hx includes multiple CVAs resulting in late effects of dysphagia and profound expressive aphasia(receptive?), [facility]. In 2014, a MBSS was performed identifying oropharyngeal phase dysphagia w/ increased risk for aspiration then. Pt was recommended to be on a Pureed diet(Dysphagia level 1) w/ Thin liquids w/ Aspiration Precautions; pt required 100% feeding assistance at all meals then, and now, d/t debilitation. He lives with his wife who takes care of him along with home health aides d/t debilitation (per his daughter Carollee HerterShannon). Pt is currently on a puree diet w/ thin liquids at home and communicates nonverbally through head nods, however, he appears to have some Cognitive decline, or inconsistency, w/ follow through. His daughter was present during evaluation and described/characterize pt's baseline diet consistency as well as his swallowing abilities and how they help him at meals.  Pt was given trials of ice chips, thin liquids via Cup(pouring small amounts into anterior oral cavity), and puree during this evaluation. During trials of ice chips, pt exhibited lack of bolus awareness/control w/ decreased lingual movement for bolus manipulation and A-P transfer. Pt exhibited a mod cough response x2 during trials of ice chips - suspect pt lost concentration to task  as ice chip melted entering the pharyngeal-laryngeal area(pt often looked around and phonated). No decline in respiratory status was noted, and vocal quality did not become overly wet and  gurgly. During trials of thin liquid via Cup(poured in small, single amounts), pt again exhibited lack of oral motor control w/ decreased labial seal around cup/bolus then oral holding followed by (suspected) premature spillage d/t lack of bolus control. Pt then exhibited hard, audible swallows w/ the trials of thin liquid - suspect delayed swallow initiation. During trials of puree, pt exhibited similar oral phase deficits c/b decreased labial seal around the spoon, min oral holding, lengthy lingual movements and "munching" behavior working on the puree for A-P transit to swallow; overall oral phase time w/ puree trials was lengthy. Audible swallows were noted again. No overall, overt decline in respiratory status or wet vocal quality was noted. Pt required 100% total assist w/ feeding of po's; strict monitoring and aspiration precautions.  Family was educated on aspiration precautions including sitting upright during meals, reducing distractions, small, single bites/sips, thin liquids placed anteriorly in mouth, moistening purees for ease of manipulation and transfer, and checking for oral clearing between bites - information on the RiJi Dysphagia Cup for aiding drinking of liquids.  D/t pt's baseline Dysphagia diagnosis and swallowing presentation w/ family over the years, Cognitive status, and his family's ability to monitor oral intake w/ precautions, recommend continuing w/ the Dysphagia level 1 diet w/ thin liquids as recommended in the past w/ strict aspiration precautions and monitoring of Pulmonary status; Pills CRUSHED in a PUREE; feeding support and monitoring w/ all po's. If pt appears to be having negative sequelae from suspected aspiration events(moreso w/ thin liquids), recommend consulting MD and ST services to determine need to modify consistency of liquids.  Recommend ST services will f/u for pt status, diet toleration, and education w/ family while admitted. NSG/MD updated.   SLP Visit  Diagnosis: Dysphagia, oropharyngeal phase (R13.12)    Aspiration Risk  Moderate aspiration risk    Diet Recommendation   Dysphagia level 1 w/ Thin liquids via Cup or pinched straw or spoon - fed to him carefully; strict aspiration precautions; monitoring w/ all oral intake.   Medication Administration: Crushed with puree    Other  Recommendations Recommended Consults: (dietician f/u) Oral Care Recommendations: Oral care BID;Staff/trained caregiver to provide oral care   Follow up Recommendations (TBD)      Frequency and Duration min 2x/week  1 week       Prognosis Prognosis for Safe Diet Advancement: Fair Barriers to Reach Goals: Cognitive deficits;Severity of deficits      Swallow Study   General Date of Onset: 06/15/17 HPI: Pt is a 76 y.o. male with a known history of sigmoid volvulus, hypertension, pulmonary nodule, cardiac arrest, CVAs, hypokalemia. The patient is sent from skilled nursing facility to the ED. The patient is nonverbal and noncommunicative, he is bedbound per caregiver. Per caregiver and his wife, the patient was found abdominal pain and distention in the nursing home. x-ray was done of his abdomen suspicious for obstruction and volvulus(pt has had a similar issue in the past). ED physician discussed case with Dr. Excell Seltzerooper of surgery, GI; pt underwent a decompressive sigmoidoscopy for sigmoid volvulus. In 2014, [facility] Pt had hx of CVA years prior w/ resulting dysphagia and aphasia. He reported trouble swallowing, trouble swallowing pills, and did have a h/o pneumonia dx. A MBSS was performed w/ dx'd Oropharyngeal phase dysphagia  and recommendations for a Pureed diet w/ Thin liquids w/ Strict Aspiration precautions - increased risk for aspiration, per report then. Pt lives with his wife, who takes care of him along with home health aides (per his daughter Carollee Herter) and is apparently debilitated at baseline due to multiple strokes. 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 He apparently eats a puree/mashed soft diet w/ thin liquids which family members feed to him as he is unable to feed himself. Type of Study: Bedside Swallow Evaluation Previous Swallow Assessment: 2014 - [facility] Diet Prior to this Study: Thin liquids;Dysphagia 1 (puree) Temperature Spikes Noted: No(wbc WNL) Respiratory Status: Room air History of Recent Intubation: No Behavior/Cognition: Alert;Cooperative;Pleasant mood;Confused;Distractible;Requires cueing Oral Cavity Assessment: Within Functional Limits Oral Care Completed by SLP: Recent completion by staff Oral Cavity - Dentition: Edentulous Vision: Functional for self-feeding Self-Feeding Abilities: Total assist Patient Positioning: Upright in bed Baseline Vocal Quality: Low vocal intensity(phonates) Volitional Cough: Strong Volitional Swallow: Able to elicit    Oral/Motor/Sensory Function Overall Oral Motor/Sensory Function: Within functional limits(for bolus control/management)   Ice Chips Ice chips: Impaired Presentation: Spoon Oral Phase Impairments: Reduced labial seal;Reduced lingual movement/coordination;Poor awareness of bolus Oral Phase Functional Implications: Oral holding Pharyngeal Phase Impairments: Cough - Delayed;Suspected delayed Swallow(2x)   Thin Liquid Thin Liquid: Impaired(2 trials) Presentation: Cup Oral Phase Impairments: Reduced labial seal Oral Phase Functional Implications: Left anterior spillage;Prolonged oral transit Pharyngeal  Phase Impairments: Cough - Immediate;Suspected delayed Swallow(audible swallow)    Nectar Thick Nectar Thick Liquid: Not tested   Honey Thick Honey Thick Liquid: Not tested   Puree Puree: Impaired(2 trials) Presentation: Spoon Oral Phase Impairments: Reduced labial  seal;Reduced lingual movement/coordination;Poor awareness of bolus Oral Phase Functional Implications: Oral holding;Oral residue;Prolonged oral transit Pharyngeal Phase Impairments: Cough - Immediate;Suspected delayed Swallow(1x)   Solid   GO   Solid: Not tested       Nancy Fetter, SLP-Graduate Student Nancy Fetter 06/16/2017,5:17 PM   The information in this patient note, response to treatment, and overall treatment plan developed has been reviewed and agreed upon by this clinician.  Jerilynn Som, MS, CCC-SLP [phone] 06/17/17,2:41 PM

## 2017-06-16 NOTE — Progress Notes (Signed)
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 Pharmacy consulted to monitor and replace electrolytes due to hypokalemia.   Sodium (mmol/L)  Date Value  06/16/2017 145  12/28/2013 141   Potassium (mmol/L)  Date Value  06/16/2017 2.8 (L)  12/28/2013 3.7   Magnesium (mg/dL)  Date Value  16/10/960411/12/2016 1.9   Calcium (mg/dL)  Date Value  54/09/811911/01/2017 8.2 (L)   Calcium, Total (mg/dL)  Date Value  14/78/295605/20/2015 8.1 (L)   Albumin (g/dL)  Date Value  21/30/865711/12/2016 4.1  12/27/2013 3.7   K replacement with IVF due to nature of illness s/p 4 runs of KCl last night and IVF with K started this AM due to persistent hypokalemia providing 72 meq of K daily. Will recheck K tonight as well as BMET and Mg in AM and replace as indicated.   Luisa HartScott Diya Gervasi, PharmD Clinical Pharmacist

## 2017-06-16 NOTE — Progress Notes (Addendum)
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 Pharmacy consulted to monitor and replace electrolytes due to hypokalemia.   Sodium (mmol/L)  Date Value  06/16/2017 145  12/28/2013 141   Potassium (mmol/L)  Date Value  06/16/2017 2.9 (L)  12/28/2013 3.7   Magnesium (mg/dL)  Date Value  47/82/956211/12/2016 1.9   Calcium (mg/dL)  Date Value  13/08/657811/01/2017 8.2 (L)   Calcium, Total (mg/dL)  Date Value  46/96/295205/20/2015 8.1 (L)   Albumin (g/dL)  Date Value  84/13/244011/12/2016 4.1  12/27/2013 3.7   K replacement with IVF due to nature of illness s/p 4 runs of KCl last night and IVF with K started this AM due to persistent hypokalemia providing 72 meq of K daily. Will recheck K tonight as well as BMET and Mg in AM and replace as indicated.   11/6:  K @ 18:00 = 2.9 Will order KCl 10 mEq IV X 4 to start on 11/6 @ 22:00.  Will recheck electrolytes on 11/7 with AM labs.   Tanice Petre D Clinical Pharmacist

## 2017-06-16 NOTE — NC FL2 (Signed)
  [place] MEDICAID FL2 LEVEL OF CARE SCREENING TOOL     IDENTIFICATION  Patient Name: Bill [name] Birthdate: 03/21/1941 Sex: male Admission Date (Current Location): 06/15/2017  [place]   Facility and Address:  [facility], [address], [place], [place] [address]      Provider Number: [id number]  Attending Physician Name and Address:  Enedina FinnerPatel, Sona, MD  Relative Name and Phone Number:       Current Level of Care: Hospital Recommended Level of Care: Skilled Nursing Facility Prior Approval Number:    Date Approved/Denied:   PASRR Number:    Discharge Plan: SNF    Current Diagnoses: Patient Active Problem List   Diagnosis Date Noted  . Cardiac arrest (HCC) 02/01/2017  . HTN (hypertension) 02/01/2017  . 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 H/O: CVA (cerebrovascular accident) 04/13/2016  . Pulmonary nodule 04/13/2016  . Hypokalemia 04/13/2016    Orientation RESPIRATION BLADDER Height & Weight        Normal Incontinent Weight: 106 lb (48.1 kg) Height:     BEHAVIORAL SYMPTOMS/MOOD NEUROLOGICAL BOWEL NUTRITION STATUS  (none) (none) Incontinent Diet(currently full liquid but to be advanced)  AMBULATORY STATUS COMMUNICATION OF NEEDS Skin   Total Care Non-Verbally Normal                       Personal Care Assistance Level of Assistance  Total care       Total Care Assistance: Maximum assistance   Functional Limitations Info  Speech     Speech Info: Impaired    SPECIAL CARE FACTORS FREQUENCY  PT (By licensed PT)                    Contractures Contractures Info: Not present    Additional Factors Info  Code Status, Allergies Code Status Info: full Allergies Info: nka           Current Medications (06/16/2017):  This is the current hospital active medication list Current Facility-Administered Medications  Medication Dose Route Frequency Provider Last Rate Last Dose  . 0.9 % NaCl  with KCl 40 mEq / L  infusion   Intravenous Continuous Shaune Pollackhen, Qing, MD 75 mL/hr at 06/16/17 0443 75 mL/hr at 06/16/17 0443  . acetaminophen (TYLENOL) tablet 650 mg  650 mg Oral Q6H PRN Shaune Pollackhen, Qing, MD       Or  . acetaminophen (TYLENOL) suppository 650 mg  650 mg Rectal Q6H PRN Shaune Pollackhen, Qing, MD      . albuterol (PROVENTIL) (2.5 MG/3ML) 0.083% nebulizer solution 2.5 mg  2.5 mg Nebulization Q2H PRN Shaune Pollackhen, Qing, MD      . enoxaparin (LOVENOX) injection 40 mg  40 mg Subcutaneous Q24H Shaune Pollackhen, Qing, MD   40 mg at 06/16/17 1101  . ondansetron (ZOFRAN) tablet 4 mg  4 mg Oral Q6H PRN Shaune Pollackhen, Qing, MD       Or  . ondansetron [facility](ZOFRAN) injection 4 mg  4 mg Intravenous Q6H PRN Shaune Pollackhen, Qing, MD         Discharge Medications: Please see discharge summary for a list of discharge medications.  Relevant Imaging Results:  Relevant Lab Results:   Additional Information    York SpanielMonica Kaydense Rizo, LCSW

## 2017-06-16 NOTE — Consult Note (Signed)
Surgical Consultation  06/16/2017  Bill [name] is an 76 y.o. male.   Referring Physician: Posey Pronto  CC: Volvulus  HPI: This patient with recurrent sigmoid volvulus he last had an attack of this as far as we can tell back in September 2017. He was treated believe in [place] for that. The patient is nonverbal but shakes his head yes and no to questions. He underwent a decompressive sigmoidoscopy last night for sigmoid volvulus. He is nonverbal but shakes his head no when asked about any abdominal pain or nausea. He also shakes his head yes when asked about passing gas.  Review of systems is not possible due to his nonverbal status.  History is reviewed in the chart and discussed with the emergency room physician last night. I also discussed this with GI earlier today.  Past Medical History:  Diagnosis Date  . Aphagia   . HTN (hypertension)   . Sigmoid volvulus ([place])   . Stroke [facility]) 2002   completely dependent with ADLs/IADLs    History reviewed. No pertinent surgical history.  Family History  Problem Relation Age of Onset  . Stroke Father   . Prostate cancer Father     Social History:  reports that  has never smoked. he has never used smokeless tobacco. He reports that he does not drink alcohol or use drugs.  Allergies: No Known Allergies  Medications reviewed.   Review of Systems:   Review of Systems  Unable to perform ROS: Patient nonverbal     Physical Exam:  BP (!) 110/52   Pulse 76   Temp 98.6 F (37 C)   Resp 14   Wt 106 lb (48.1 kg)   SpO2 97%   BMI 15.21 kg/m   Physical Exam  Constitutional: He is oriented to person, place, and time.  Thin and cachectic male patient. Vital signs are reviewed. He smells of urine and his diaper is wet with urine.  HENT:  Head: Normocephalic and atraumatic.  Eyes: Pupils are equal, round, and reactive to light. Right eye exhibits no discharge. Left eye exhibits no discharge. No scleral icterus.  Neck: Normal  range of motion. No JVD present.  Cardiovascular: Normal rate and regular rhythm.  Pulmonary/Chest: Effort normal. No respiratory distress.  Abdominal: Soft. He exhibits no distension. There is no tenderness. There is no rebound and no guarding.  Nondistended nontympanitic and nontender.  Genitourinary: Penis normal. No discharge found.  Musculoskeletal: Normal range of motion. He exhibits no edema or tenderness.  Apparent contractures and muscle wasting  Lymphadenopathy:    He has no cervical adenopathy.  Neurological: He is alert and oriented to person, place, and time.  Skin: Skin is warm and dry. No rash noted. No erythema.  Psychiatric: Mood and affect normal.  Vitals reviewed.     Results for orders placed or performed during the hospital encounter of 06/15/17 (from the past 48 hour(s))  Lipase, blood     Status: None   Collection Time: 06/15/17  4:25 PM  Result Value Ref Range   Lipase 16 11 - 51 U/L  Comprehensive metabolic panel     Status: Abnormal   Collection Time: 06/15/17  4:25 PM  Result Value Ref Range   Sodium 146 (H) 135 - 145 mmol/L   Potassium 2.6 (LL) 3.5 - 5.1 mmol/L    Comment: CRITICAL RESULT CALLED TO, READ BACK BY AND VERIFIED WITH LAURA CATES RN AT 1700 06/15/17. MSS    Chloride 104 101 - 111 mmol/L  CO2 27 22 - 32 mmol/L   Glucose, Bld 84 65 - 99 mg/dL   BUN 17 6 - 20 mg/dL   Creatinine, Ser 0.57 (L) 0.61 - 1.24 mg/dL   Calcium 9.0 8.9 - 10.3 mg/dL   Total Protein 7.6 6.5 - 8.1 g/dL   Albumin 4.1 3.5 - 5.0 g/dL   AST 24 15 - 41 U/L   ALT 14 (L) 17 - 63 U/L   Alkaline Phosphatase 75 38 - 126 U/L   Total Bilirubin 1.3 (H) 0.3 - 1.2 mg/dL   GFR calc non Af Amer >60 >60 mL/min   GFR calc Af Amer >60 >60 mL/min    Comment: (NOTE) The eGFR has been calculated using the CKD EPI equation. This calculation has not been validated in all clinical situations. eGFR's persistently <60 mL/min signify possible Chronic Kidney Disease.    Anion gap 15 5 -  15  CBC     Status: Abnormal   Collection Time: 06/15/17  4:25 PM  Result Value Ref Range   WBC 13.5 (H) 3.8 - 10.6 K/uL   RBC 4.40 4.40 - 5.90 MIL/uL   Hemoglobin 14.0 13.0 - 18.0 g/dL   HCT 41.6 40.0 - 52.0 %   MCV 94.7 80.0 - 100.0 fL   MCH 31.8 26.0 - 34.0 pg   MCHC 33.6 32.0 - 36.0 g/dL   RDW 14.1 11.5 - 14.5 %   Platelets 187 150 - 440 K/uL  Magnesium     Status: None   Collection Time: 06/15/17  4:25 PM  Result Value Ref Range   Magnesium 1.9 1.7 - 2.4 mg/dL  Basic metabolic panel     Status: Abnormal   Collection Time: 06/16/17  3:32 AM  Result Value Ref Range   Sodium 145 135 - 145 mmol/L   Potassium 2.8 (L) 3.5 - 5.1 mmol/L   Chloride 107 101 - 111 mmol/L   CO2 28 22 - 32 mmol/L   Glucose, Bld 70 65 - 99 mg/dL   BUN 17 6 - 20 mg/dL   Creatinine, Ser 0.45 (L) 0.61 - 1.24 mg/dL   Calcium 8.2 (L) 8.9 - 10.3 mg/dL   GFR calc non Af Amer >60 >60 mL/min   GFR calc Af Amer >60 >60 mL/min    Comment: (NOTE) The eGFR has been calculated using the CKD EPI equation. This calculation has not been validated in all clinical situations. eGFR's persistently <60 mL/min signify possible Chronic Kidney Disease.    Anion gap 10 5 - 15  CBC     Status: Abnormal   Collection Time: 06/16/17  3:32 AM  Result Value Ref Range   WBC 9.7 3.8 - 10.6 K/uL   RBC 3.84 (L) 4.40 - 5.90 MIL/uL   Hemoglobin 12.2 (L) 13.0 - 18.0 g/dL   HCT 36.1 (L) 40.0 - 52.0 %   MCV 94.2 80.0 - 100.0 fL   MCH 31.7 26.0 - 34.0 pg   MCHC 33.6 32.0 - 36.0 g/dL   RDW 14.2 11.5 - 14.5 %   Platelets 146 (L) 150 - 440 K/uL   Ct Abdomen Pelvis Wo Contrast  Result Date: 06/15/2017 CLINICAL DATA:  Abdominal pain and distention question bowel obstruction, past history of bowel obstruction and stroke ; patient nonverbal, unable to provide additional history EXAM: CT ABDOMEN AND PELVIS WITHOUT CONTRAST TECHNIQUE: Multidetector CT imaging of the abdomen and pelvis was performed following the standard protocol without IV  contrast. Sagittal and coronal MPR images reconstructed from  axial data set. Oral contrast was not administered. COMPARISON:  04/13/2016 FINDINGS: Lower chest: Lung bases appear hyperinflated with subsegmental atelectasis at LEFT lower lobe. Calcified plaque RIGHT lower chest. Hepatobiliary: The liver and gallbladder normal appearance Pancreas: Normal appearance Spleen: Normal appearance Adrenals/Urinary Tract: Minimal adrenal thickening without discrete mass. Kidneys, ureters and bladder normal appearance Stomach/Bowel: Normal appendix. Small hiatal hernia. Stomach and small bowel loops otherwise unremarkable. Gaseous distension of ascending colon containing small amount stool, overall 10.4 cm maximal diameter. Markedly distended sigmoid loop up to 11.0 cm diameter associated with swirling of the sigmoid colon and sigmoid mesocolon compatible with sigmoid volvulus, demonstrated on both axial and coronal imaging. Mild distention of the rectum by fluid. Transverse colon distended by gas up to 7 cm diameter. No bowel wall thickening or evidence of perforation. Vascular/Lymphatic: Scattered atherosclerotic calcifications of coronary arteries, aorta, and iliac arteries. Atherosclerotic calcifications at origin of LEFT renal artery. Aorta normal caliber. No definite adenopathy. Reproductive: Mild prostatic enlargement. Other: Minimal perihepatic free fluid.  No free air.  No hernia. Musculoskeletal: Diffuse osseous demineralization. IMPRESSION: Recurrent sigmoid volvulus with significant gaseous distention of the sigmoid loop up to 11 cm diameter. No evidence of perforation. Small hiatal hernia. Aortic Atherosclerosis (ICD10-I70.0). Findings called to Dr.  Clearnce Hasten on 06/15/2017 at 1740 hours. Electronically Signed   By: Lavonia Dana M.D.   On: 06/15/2017 17:41    Assessment/Plan:  CT scan, KUB and labs are all reviewed. Notes from flexible sigmoidoscopy are reviewed as well. Past notes from prior sigmoid volvulus  are reviewed as well. Currently patient shows no sign of sigmoid volvulus and while I'm not sure how reliable his answers are he states that he has no pain and is passing gas this verbalize via shaking of the head and a nonverbal manner.  I see no sign for the need for surgical intervention in this patient as he has a history of having this recur but once a year. If this were to recur quickly then either bowel prep and colon resection or colostomy could be performed in this debilitated risk patient.  Florene Glen, MD, FACS

## 2017-06-17 ENCOUNTER — Inpatient Hospital Stay: Payer: No Typology Code available for payment source

## 2017-06-17 LAB — BASIC METABOLIC PANEL
ANION GAP: 5 (ref 5–15)
BUN: 11 mg/dL (ref 6–20)
CALCIUM: 8.1 mg/dL — AB (ref 8.9–10.3)
CO2: 29 mmol/L (ref 22–32)
CREATININE: 0.36 mg/dL — AB (ref 0.61–1.24)
Chloride: 111 mmol/L (ref 101–111)
GFR calc non Af Amer: >60 mL/min (ref >60)
Glucose, Bld: 85 mg/dL (ref 65–99)
Potassium: 2.8 mmol/L — ABNORMAL LOW (ref 3.5–5.1)
SODIUM: 145 mmol/L (ref 135–145)

## 2017-06-17 LAB — MAGNESIUM: MAGNESIUM: 1.6 mg/dL — AB (ref 1.7–2.4)

## 2017-06-17 LAB — MRSA PCR SCREENING: MRSA BY PCR: NEGATIVE

## 2017-06-17 MED ORDER — POTASSIUM CHLORIDE 10 MEQ/100ML IV SOLN
10.0000 meq | INTRAVENOUS | Status: AC
  Administered 2017-06-17 (×3): 10 meq via INTRAVENOUS
  Filled 2017-06-17 (×3): qty 100

## 2017-06-17 MED ORDER — POTASSIUM CHLORIDE 20 MEQ/15ML (10%) PO SOLN
40.0000 meq | Freq: Once | ORAL | Status: AC
  Administered 2017-06-17: 40 meq via ORAL
  Filled 2017-06-17: qty 30

## 2017-06-17 MED ORDER — MAGNESIUM SULFATE 2 GM/50ML IV SOLN
2.0000 g | Freq: Once | INTRAVENOUS | Status: AC
Start: 2017-06-17 — End: 2017-06-17
  Administered 2017-06-17: 2 g via INTRAVENOUS
  Filled 2017-06-17: qty 50

## 2017-06-17 NOTE — Discharge Summary (Signed)
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  Patient completely dependent with ADL.  At baseline he is nonverbal  SECONDARY DIAGNOSIS:   Past Medical History:  Diagnosis Date  . Aphagia   . HTN (hypertension)   . Sigmoid volvulus (HCC)   . Stroke [facility](HCC) 2002   completely dependent with ADLs/IADLs    HOSPITAL COURSE:   [name] a76 y.o.malewith a known history of sigmoid volvulus, hypertension, pulmonary nodule, cardiac arrest and hypokalemia. The patient is sent from skilled nursing facility to the ED due to above chief complaints. The patient is nonverbal and noncommunicative,he is bedbound for caregiver.   1.  Sigmoid volvulus-recurrent -Patient underwent emergent GI decompression -Started on liquid diet.  X-ray abdomen today shows sigmoid volvulus 9.6 cm--repeat x-ray this morning shows improvement -Surgical consultation made.  Discussed with Dr. Excell Seltzerooper.  At present no surgical indication however if it recurs in the next few weeks to months then would recommend colostomy. -Was discussed with patient's daughter Bill [name].  Is with her on the phone.  She understood the surgical indication if needed down the road. -Patient tolerating pured diet. -Per nursing staff he did have a BM yesterday  2.  History of CVA -Patient is bedbound, total care, aphasic -[facility]  3.  Hypokalemia -IV KCl replacement -Pharmacy consult  4.  DVT prophylaxis Subcut heparin  Hemodynamically stable.  [facility].  Daughter  notified.   CONSULTS OBTAINED:  Treatment Team:  Lattie Hawooper, Bill E, MD  DRUG ALLERGIES:  No Known Allergies  DISCHARGE MEDICATIONS:   Current Discharge Medication List    CONTINUE these medications which have NOT CHANGED   Details  cholecalciferol (VITAMIN D) 1000 units tablet Take 2,000 Units by mouth daily.    acetaminophen (TYLENOL) 325 MG tablet Take 650 mg by mouth every 6 (six) hours as needed.    aspirin 81 MG chewable tablet Chew 81 mg by mouth daily. Crush tablet    ondansetron (ZOFRAN) 4 MG tablet Take 1 tablet (4 mg total) by mouth every 6 (six) hours as needed for nausea. Qty: 20 tablet, Refills: 0    spironolactone (ALDACTONE) 25 MG tablet Take 12.5 mg by mouth daily.        If you experience worsening of your admission symptoms, develop shortness of breath, life threatening emergency, suicidal or homicidal thoughts you must seek medical attention immediately by calling 911 or calling your MD immediately  if symptoms less severe.  You Must read complete instructions/literature along with all the possible adverse reactions/side effects for all the Medicines you take and that have been prescribed to you. Take any new Medicines after you have completely understood and accept all the possible adverse reactions/side effects.   Please note  You were cared for by a hospitalist during your hospital stay. If you have any questions about your discharge medications or the care you received while you were in the hospital after you are discharged, you can call the unit and asked to speak with the hospitalist on call if the hospitalist that took  care of you is not available. Once you are discharged, your primary care physician will handle any further medical issues. Please note that NO REFILLS for any discharge medications will be authorized once you are discharged, as it is imperative that you return to your primary care physician (or establish a relationship with a primary care  physician if you do not have one) for your aftercare needs so that they can reassess your need for medications and monitor your lab values. Today   SUBJECTIVE   Nonverbal  VITAL SIGNS:  Blood pressure 139/67, pulse 67, temperature (!) 96.8 F (36 C), temperature source Axillary, resp. rate 14, weight 48.1 kg (106 lb), SpO2 100 %.  I/O:    Intake/Output Summary (Last 24 hours) at 06/17/2017 1327 Last data filed at 06/17/2017 1303 Gross per 24 hour  Intake 2013.75 ml  Output -  Net 2013.75 ml    PHYSICAL EXAMINATION:  GENERAL:  76 y.o.-year-old patient lying in the bed with no acute distress.  EYES: Pupils equal, round, reactive to light and accommodation. No scleral icterus. Extraocular muscles intact.  HEENT: Head atraumatic, normocephalic. Oropharynx and nasopharynx clear.  LUNGS: Normal breath sounds bilaterally, no wheezing, rales,rhonchi or crepitation. No use of accessory muscles of respiration.  CARDIOVASCULAR: S1, S2 normal. No murmurs, rubs, or gallops.  ABDOMEN: Soft, non-tender, non-distended. Bowel sounds present. No organomegaly or mass.  EXTREMITIES: No pedal edema, cyanosis, or clubbing.  NEUROLOGIC: Patient nonverbal due to previous stroke he is bedbound  pSYCHIATRIC: The patient is alert nonverbal.  Nods to yes and no questions    DATA REVIEW:   CBC  Recent Labs  Lab 06/16/17 0332  WBC 9.7  HGB 12.2*  HCT 36.1*  PLT 146*    Chemistries  Recent Labs  Lab 06/15/17 1625  06/17/17 0618  NA 146*   < > 145  K 2.6*   < > 2.8*  CL 104   < > 111  CO2 27   < > 29  GLUCOSE 84   < > 85  BUN 17   < > 11  CREATININE 0.57*   < > 0.36*  CALCIUM 9.0   < > 8.1*  MG 1.9  --  1.6*  AST 24  --   --   ALT 14*  --   --   ALKPHOS 75  --   --   BILITOT 1.3*  --   --    < > = values in this interval not displayed.    Microbiology Results   Recent Results (from the past 240 hour(s))  MRSA PCR Screening     Status: None   Collection Time: 06/17/17  8:00 AM   Result Value Ref Range Status   MRSA by PCR NEGATIVE NEGATIVE Final    Comment:        The GeneXpert MRSA Assay (FDA approved for NASAL specimens only), is one component of a comprehensive MRSA colonization surveillance program. It is not intended to diagnose MRSA infection nor to guide or monitor treatment for MRSA infections.     RADIOLOGY:  Ct Abdomen Pelvis Wo Contrast  Result Date: 06/15/2017 CLINICAL DATA:  Abdominal pain and distention question bowel obstruction, past history of bowel obstruction and stroke ; patient nonverbal, unable to provide additional history EXAM: CT ABDOMEN AND PELVIS WITHOUT CONTRAST TECHNIQUE: Multidetector CT imaging of the abdomen and pelvis was performed following the standard protocol without IV contrast. Sagittal and coronal MPR images reconstructed from axial data set. Oral contrast  was not administered. COMPARISON:  04/13/2016 FINDINGS: Lower chest: Lung bases appear hyperinflated with subsegmental atelectasis at LEFT lower lobe. Calcified plaque RIGHT lower chest. Hepatobiliary: The liver and gallbladder normal appearance Pancreas: Normal appearance Spleen: Normal appearance Adrenals/Urinary Tract: Minimal adrenal thickening without discrete mass. Kidneys, ureters and bladder normal appearance Stomach/Bowel: Normal appendix. Small hiatal hernia. Stomach and small bowel loops otherwise unremarkable. Gaseous distension of ascending colon containing small amount stool, overall 10.4 cm maximal diameter. Markedly distended sigmoid loop up to 11.0 cm diameter associated with swirling of the sigmoid colon and sigmoid mesocolon compatible with sigmoid volvulus, demonstrated on both axial and coronal imaging. Mild distention of the rectum by fluid. Transverse colon distended by gas up to 7 cm diameter. No bowel wall thickening or evidence of perforation. Vascular/Lymphatic: Scattered atherosclerotic calcifications of coronary arteries, aorta, and iliac arteries.  Atherosclerotic calcifications at origin of LEFT renal artery. Aorta normal caliber. No definite adenopathy. Reproductive: Mild prostatic enlargement. Other: Minimal perihepatic free fluid.  No free air.  No hernia. Musculoskeletal: Diffuse osseous demineralization. IMPRESSION: Recurrent sigmoid volvulus with significant gaseous distention of the sigmoid loop up to 11 cm diameter. No evidence of perforation. Small hiatal hernia. Aortic Atherosclerosis (ICD10-I70.0). Findings called to Dr.  Pershing Proud on 06/15/2017 at 1740 hours. Electronically Signed   By: Ulyses Southward M.D.   On: 06/15/2017 17:41   Dg Abd 1 View  Result Date: 06/17/2017 CLINICAL DATA:  Volvulus EXAM: ABDOMEN - 1 VIEW COMPARISON:  Abdominal radiographs of June 16, 2017 FINDINGS: There remain loops of mildly distended gas-filled small and large bowel. Overall the degree of distention has decreased. A moderate amount of stool is noted in the right and left colon. No definite free extraluminal gas collections are observed. IMPRESSION: Moderate interval decrease in the degree of sigmoid colonic distention and distention of other bowel loops. No evidence of perforation. Electronically Signed   By: David  Swaz[place] M.D.   On: 06/17/2017 09:55   Dg Abd 1 View  Result Date: 06/16/2017 CLINICAL DATA:  Abdominal distention.  Recent sigmoid volvulus EXAM: ABDOMEN - 1 VIEW COMPARISON:  CT abdomen and pelvis June 15, 2017 FINDINGS: There remains distention of the sigmoid colon with much of the sigmoid colon present in the upper abdomen. The diameter of the sigmoid colon measures up to 9.6 cm, slightly less than on CT 1 day prior. There is no other bowel distention. There is moderate stool in the right colon. No free air evident. No air-fluid levels. IMPRESSION: There remains distention of the sigmoid colon with sigmoid colon in the upper abdomen. There is concern for persistence of a degree of sigmoid volvulus. No free air or intramural air seen. No  other bowel dilatation. No air-fluid levels. The overall degree of distention of the sigmoid colon is slightly less than on study from 1 day prior. These results will be called to the ordering clinician or representative by the Radiologist Assistant, and communication documented in the PACS or zVision Dashboard. Electronically Signed   By: Bretta Bang III M.D.   On: 06/16/2017 10:38     Management plans discussed with the patient, family and they are in agreement.  CODE STATUS:     Code Status Orders  (From admission, onward)        Start     Ordered   06/15/17 2130  Full code  Continuous     06/15/17 2130    Code Status History    Date Active Date Inactive Code Status Order ID  Comments User Context   02/01/2017 19:45 02/02/2017 20:00 Full Code [id number]  Oralia ManisWillis, David, MD Inpatient   04/14/2016 01:48 04/23/2016 16:58 Full Code [id number]  Jonah BlueYates, Jennifer, MD Inpatient    Advance Directive Documentation     Most Recent Value  Type of Advance Directive  Out of facility DNR (pink MOST or yellow form)  Pre-existing out of facility DNR order (yellow form or pink MOST form)  Pink MOST form placed in chart (order not valid for inpatient use)  "MOST" Form in Place?  No data      TOTAL TIME TAKING CARE OF THIS PATIENT: 40- minutes.    Sava Proby M.D on 06/17/2017 at 1:27 PM  Between 7am to 6pm - Pager - [phone] After 6pm go to [email or web address] - password [email or web address]  [facility]  Office  [phone]  CC: Primary care physician; System, Pcp Not In

## 2017-06-17 NOTE — Progress Notes (Signed)
CC: Sigmoid volvulus Subjective: This nonverbal patient who suffered a stroke but is able to nod his head or shake his head for yes or no answers.  He describes or answers via shaking his head that he has no abdominal pain and I cannot tell for sure but it seems that he is passing gas.  He has not vomited.  No other review of systems is possible  Objective: Vital signs in last 24 hours: Temp:  [98.1 F (36.7 C)-98.7 F (37.1 C)] 98.4 F (36.9 C) (11/07 0611) Pulse Rate:  [74-95] 78 (11/07 0611) Resp:  [12-18] 18 (11/07 0611) BP: (128-139)/(68-74) 137/74 (11/07 0611) SpO2:  [96 %-100 %] 100 % (11/07 0611) Last BM Date: 06/16/17  Intake/Output from previous day: 11/06 0701 - 11/07 0700 In: 2020 [P.O.:450; I.V.:1570] Out: -  Intake/Output this shift: Total I/O In: 18 [I.V.:18] Out: -   Physical exam:  Vital signs are stable and afebrile abdomen is soft nondistended nontympanitic and nontender Contractures are noted no icterus no jaundice  Lab Results: CBC  Recent Labs    06/15/17 1625 06/16/17 0332  WBC 13.5* 9.7  HGB 14.0 12.2*  HCT 41.6 36.1*  PLT 187 146*   BMET Recent Labs    06/16/17 0332 06/16/17 1915 06/17/17 0618  NA 145  --  145  K 2.8* 2.9* 2.8*  CL 107  --  111  CO2 28  --  29  GLUCOSE 70  --  85  BUN 17  --  11  CREATININE 0.45*  --  0.36*  CALCIUM 8.2*  --  8.1*   PT/INR No results for input(s): LABPROT, INR in the last 72 hours. ABG No results for input(s): PHART, HCO3 in the last 72 hours.  Invalid input(s): PCO2, PO2  Studies/Results: Ct Abdomen Pelvis Wo Contrast  Result Date: 06/15/2017 CLINICAL DATA:  Abdominal pain and distention question bowel obstruction, past history of bowel obstruction and stroke ; patient nonverbal, unable to provide additional history EXAM: CT ABDOMEN AND PELVIS WITHOUT CONTRAST TECHNIQUE: Multidetector CT imaging of the abdomen and pelvis was performed following the standard protocol without IV contrast.  Sagittal and coronal MPR images reconstructed from axial data set. Oral contrast was not administered. COMPARISON:  04/13/2016 FINDINGS: Lower chest: Lung bases appear hyperinflated with subsegmental atelectasis at LEFT lower lobe. Calcified plaque RIGHT lower chest. Hepatobiliary: The liver and gallbladder normal appearance Pancreas: Normal appearance Spleen: Normal appearance Adrenals/Urinary Tract: Minimal adrenal thickening without discrete mass. Kidneys, ureters and bladder normal appearance Stomach/Bowel: Normal appendix. Small hiatal hernia. Stomach and small bowel loops otherwise unremarkable. Gaseous distension of ascending colon containing small amount stool, overall 10.4 cm maximal diameter. Markedly distended sigmoid loop up to 11.0 cm diameter associated with swirling of the sigmoid colon and sigmoid mesocolon compatible with sigmoid volvulus, demonstrated on both axial and coronal imaging. Mild distention of the rectum by fluid. Transverse colon distended by gas up to 7 cm diameter. No bowel wall thickening or evidence of perforation. Vascular/Lymphatic: Scattered atherosclerotic calcifications of coronary arteries, aorta, and iliac arteries. Atherosclerotic calcifications at origin of LEFT renal artery. Aorta normal caliber. No definite adenopathy. Reproductive: Mild prostatic enlargement. Other: Minimal perihepatic free fluid.  No free air.  No hernia. Musculoskeletal: Diffuse osseous demineralization. IMPRESSION: Recurrent sigmoid volvulus with significant gaseous distention of the sigmoid loop up to 11 cm diameter. No evidence of perforation. Small hiatal hernia. Aortic Atherosclerosis (ICD10-I70.0). Findings called to Dr.  Pershing ProudSchaevitz on 06/15/2017 at 1740 hours. Electronically Signed   By:  Ulyses SouthwardMark  Boles M.D.   On: 06/15/2017 17:41   Dg Abd 1 View  Result Date: 06/16/2017 CLINICAL DATA:  Abdominal distention.  Recent sigmoid volvulus EXAM: ABDOMEN - 1 VIEW COMPARISON:  CT abdomen and pelvis  June 15, 2017 FINDINGS: There remains distention of the sigmoid colon with much of the sigmoid colon present in the upper abdomen. The diameter of the sigmoid colon measures up to 9.6 cm, slightly less than on CT 1 day prior. There is no other bowel distention. There is moderate stool in the right colon. No free air evident. No air-fluid levels. IMPRESSION: There remains distention of the sigmoid colon with sigmoid colon in the upper abdomen. There is concern for persistence of a degree of sigmoid volvulus. No free air or intramural air seen. No other bowel dilatation. No air-fluid levels. The overall degree of distention of the sigmoid colon is slightly less than on study from 1 day prior. These results will be called to the ordering clinician or representative by the Radiologist Assistant, and communication documented in the PACS or zVision Dashboard. Electronically Signed   By: Bretta BangWilliam  Woodruff III M.D.   On: 06/16/2017 10:38    Anti-infectives: Anti-infectives (From admission, onward)   None      Assessment/Plan:  Labs reviewed potassium of 2.8.  KUB pending for this morning.  Patient shook his head when I talked about possible surgery but I will discussed this with family once they arrive and I have discussed this patient with Dr. Allena KatzPatel as well.Lattie Haw*  Briella Hobday E Monette Omara, MD, FACS  06/17/2017

## 2017-06-17 NOTE — Progress Notes (Signed)
Iva LentoWilliam M Weddington  Discharge via wheelchair with [facility]. Report given to Leo GrosserJennifer Clapp, [facility].Pt tolerating diet well. No complaints of pain or nausea. IV removed intact, prescriptions given.    Allergies as of 06/17/2017   No Known Allergies     Medication List    TAKE these medications   acetaminophen 325 MG tablet Commonly known as:  TYLENOL Take 650 mg by mouth every 6 (six) hours as needed.   aspirin 81 MG chewable tablet Chew 81 mg by mouth daily. Crush tablet   cholecalciferol 1000 units tablet Commonly known as:  VITAMIN D Take 2,000 Units by mouth daily.   ondansetron 4 MG tablet Commonly known as:  ZOFRAN Take 1 tablet (4 mg total) by mouth every 6 (six) hours as needed for nausea.   spironolactone 25 MG tablet Commonly known as:  ALDACTONE Take 12.5 mg by mouth daily.       Vitals:   06/17/17 0611 06/17/17 1225  BP: 137/74 139/67  Pulse: 78 67  Resp: 18 14  Temp: 98.4 F (36.9 C) (!) 96.8 F (36 C)  SpO2: 100% 100%    Suzzanne CloudJuan G Rodriguez Ornelas

## 2017-06-17 NOTE — Progress Notes (Signed)
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 Pharmacy consulted to monitor and replace electrolytes due to hypokalemia.   Sodium (mmol/L)  Date Value  06/17/2017 145  12/28/2013 141   Potassium (mmol/L)  Date Value  06/17/2017 2.8 (L)  12/28/2013 3.7   Magnesium (mg/dL)  Date Value  40/98/119111/02/2017 1.6 (L)   Calcium (mg/dL)  Date Value  47/82/956211/02/2017 8.1 (L)   Calcium, Total (mg/dL)  Date Value  13/08/657805/20/2015 8.1 (L)   Albumin (g/dL)  Date Value  46/96/295211/12/2016 4.1  12/27/2013 3.7   Will order 6 runs of KCl IV and magnesium sulfate 2 g iv once. Will recheck K at 1800.  Luisa Harthristy, Loveah Like D Clinical Pharmacist

## 2017-06-17 NOTE — Progress Notes (Signed)
Called report to [facility]Jennifer Clapp [facility].

## 2017-06-17 NOTE — Clinical Social Work Note (Signed)
MD discharging patient today. Patient's daughter, April, notified by MD of this and CSW notified [facility] and [facility] will transport about 3:30pm. [facility]. Stanton KidneyDebra at [facility] aware that patient will be returning today. York SpanielMonica Avynn Klassen MSW,LCSW [phone]

## 2017-06-22 SURGERY — SIGMOIDOSCOPY, FLEXIBLE
Anesthesia: General | Laterality: Left

## 2017-07-18 ENCOUNTER — Emergency Department: Payer: Medicare Other

## 2017-07-18 ENCOUNTER — Observation Stay
Admission: EM | Admit: 2017-07-18 | Discharge: 2017-07-20 | Disposition: A | Discharge status: Skilled Nursing Facility | Payer: Medicare Other | Source: Home / Self Care | Attending: Emergency Medicine | Admitting: Emergency Medicine

## 2017-07-18 ENCOUNTER — Other Ambulatory Visit: Payer: Self-pay

## 2017-07-18 ENCOUNTER — Encounter: Payer: Self-pay | Admitting: Emergency Medicine

## 2017-07-18 DIAGNOSIS — I10 Essential (primary) hypertension: Secondary | ICD-10-CM

## 2017-07-18 DIAGNOSIS — Z681 Body mass index (BMI) 19 or less, adult: Secondary | ICD-10-CM

## 2017-07-18 DIAGNOSIS — I6523 Occlusion and stenosis of bilateral carotid arteries: Secondary | ICD-10-CM | POA: Insufficient documentation

## 2017-07-18 DIAGNOSIS — Z7982 Long term (current) use of aspirin: Secondary | ICD-10-CM | POA: Insufficient documentation

## 2017-07-18 DIAGNOSIS — E876 Hypokalemia: Secondary | ICD-10-CM | POA: Insufficient documentation

## 2017-07-18 DIAGNOSIS — R55 Syncope and collapse: Secondary | ICD-10-CM | POA: Diagnosis present

## 2017-07-18 DIAGNOSIS — I69320 Aphasia following cerebral infarction: Secondary | ICD-10-CM

## 2017-07-18 DIAGNOSIS — E860 Dehydration: Secondary | ICD-10-CM

## 2017-07-18 DIAGNOSIS — E878 Other disorders of electrolyte and fluid balance, not elsewhere classified: Secondary | ICD-10-CM

## 2017-07-18 DIAGNOSIS — E43 Unspecified severe protein-calorie malnutrition: Secondary | ICD-10-CM | POA: Insufficient documentation

## 2017-07-18 DIAGNOSIS — K562 Volvulus: Secondary | ICD-10-CM | POA: Diagnosis not present

## 2017-07-18 DIAGNOSIS — L8990 Pressure ulcer of unspecified site, unspecified stage: Secondary | ICD-10-CM

## 2017-07-18 DIAGNOSIS — E872 Acidosis: Secondary | ICD-10-CM | POA: Insufficient documentation

## 2017-07-18 DIAGNOSIS — I4510 Unspecified right bundle-branch block: Secondary | ICD-10-CM

## 2017-07-18 LAB — CBC WITH DIFFERENTIAL/PLATELET
BASOS ABS: 0 10*3/uL (ref 0–0.1)
BASOS PCT: 0 %
Eosinophils Absolute: 0.1 10*3/uL (ref 0–0.7)
Eosinophils Relative: 1 %
HEMATOCRIT: 43.5 % (ref 40.0–52.0)
HEMOGLOBIN: 14.5 g/dL (ref 13.0–18.0)
LYMPHS PCT: 15 %
Lymphs Abs: 1.5 10*3/uL (ref 1.0–3.6)
MCH: 31.6 pg (ref 26.0–34.0)
MCHC: 33.3 g/dL (ref 32.0–36.0)
MCV: 94.9 fL (ref 80.0–100.0)
MONOS PCT: 6 %
Monocytes Absolute: 0.6 10*3/uL (ref 0.2–1.0)
NEUTROS ABS: 7.4 10*3/uL — AB (ref 1.4–6.5)
NEUTROS PCT: 78 %
Platelets: 173 10*3/uL (ref 150–440)
RBC: 4.59 MIL/uL (ref 4.40–5.90)
RDW: 14.3 % (ref 11.5–14.5)
WBC: 9.6 10*3/uL (ref 3.8–10.6)

## 2017-07-18 LAB — COMPREHENSIVE METABOLIC PANEL
ALBUMIN: 3.8 g/dL (ref 3.5–5.0)
ALK PHOS: 72 U/L (ref 38–126)
ALT: 20 U/L (ref 17–63)
AST: 43 U/L — AB (ref 15–41)
Anion gap: 15 (ref 5–15)
BILIRUBIN TOTAL: 0.6 mg/dL (ref 0.3–1.2)
BUN: 11 mg/dL (ref 6–20)
CALCIUM: 8.6 mg/dL — AB (ref 8.9–10.3)
CO2: 30 mmol/L (ref 22–32)
CREATININE: 0.66 mg/dL (ref 0.61–1.24)
Chloride: 100 mmol/L — ABNORMAL LOW (ref 101–111)
GFR calc Af Amer: >60 mL/min (ref >60)
GFR calc non Af Amer: >60 mL/min (ref >60)
GLUCOSE: 143 mg/dL — AB (ref 65–99)
Potassium: 2.5 mmol/L — CL (ref 3.5–5.1)
Sodium: 145 mmol/L (ref 135–145)
TOTAL PROTEIN: 7.1 g/dL (ref 6.5–8.1)

## 2017-07-18 LAB — LACTIC ACID, PLASMA: Lactic Acid, Venous: 3.5 mmol/L (ref 0.5–1.9)

## 2017-07-18 LAB — TROPONIN I: Troponin I: <0.03 ng/mL (ref <0.03)

## 2017-07-18 MED ORDER — KCL IN DEXTROSE-NACL 40-5-0.45 MEQ/L-%-% IV SOLN
INTRAVENOUS | Status: DC
  Administered 2017-07-19: 01:00:00 via INTRAVENOUS
  Filled 2017-07-18 (×2): qty 1000

## 2017-07-18 MED ORDER — POTASSIUM CHLORIDE 20 MEQ PO PACK
40.0000 meq | PACK | ORAL | Status: AC
  Administered 2017-07-19 (×2): 40 meq via ORAL
  Filled 2017-07-18 (×2): qty 2

## 2017-07-18 MED ORDER — ENOXAPARIN SODIUM 40 MG/0.4ML ~~LOC~~ SOLN
40.0000 mg | SUBCUTANEOUS | Status: DC
  Administered 2017-07-19 – 2017-07-20 (×2): 40 mg via SUBCUTANEOUS
  Filled 2017-07-18 (×2): qty 0.4

## 2017-07-18 MED ORDER — ASPIRIN 81 MG PO CHEW: 81.0000 mg | CHEWABLE_TABLET | Freq: Every day | ORAL | Status: DC

## 2017-07-18 MED ORDER — ONDANSETRON HCL 4 MG/2ML IJ SOLN: 4.0000 mg | Freq: Four times a day (QID) | INTRAMUSCULAR | Status: DC | PRN

## 2017-07-18 MED ORDER — ATORVASTATIN CALCIUM 20 MG PO TABS: 20.0000 mg | ORAL_TABLET | Freq: Every day | ORAL | Status: DC

## 2017-07-18 MED ORDER — KCL IN DEXTROSE-NACL 40-5-0.45 MEQ/L-%-% IV SOLN
INTRAVENOUS | Status: DC
  Administered 2017-07-18 – 2017-07-19 (×2): via INTRAVENOUS
  Filled 2017-07-18 (×6): qty 1000

## 2017-07-18 MED ORDER — ACETAMINOPHEN 325 MG PO TABS: 650.0000 mg | ORAL_TABLET | Freq: Four times a day (QID) | ORAL | Status: DC | PRN

## 2017-07-18 MED ORDER — ACETAMINOPHEN 650 MG RE SUPP: 650.0000 mg | Freq: Four times a day (QID) | RECTAL | Status: DC | PRN

## 2017-07-18 MED ORDER — SENNOSIDES-DOCUSATE SODIUM 8.6-50 MG PO TABS: 1.0000 | ORAL_TABLET | ORAL | Status: DC

## 2017-07-18 MED ORDER — POLYETHYLENE GLYCOL 3350 17 G PO PACK: 17.0000 g | PACK | Freq: Every day | ORAL | Status: DC | PRN

## 2017-07-18 MED ORDER — ONDANSETRON HCL 4 MG PO TABS: 4.0000 mg | ORAL_TABLET | Freq: Four times a day (QID) | ORAL | Status: DC | PRN

## 2017-07-18 MED ORDER — SCOPOLAMINE 1 MG/3DAYS TD PT72
1.0000 | MEDICATED_PATCH | TRANSDERMAL | Status: DC
  Administered 2017-07-19: 1.5 mg via TRANSDERMAL
  Filled 2017-07-18 (×3): qty 1

## 2017-07-18 NOTE — ED Notes (Signed)
Per MD order, put warm blankets on the patient instead of the [employer]

## 2017-07-18 NOTE — H&P (Signed)
[employer] - [place] at [facility]   PATIENT NAME: Bill [name]    MR#:  [id number]  DATE OF BIRTH:  1941-04-25  DATE OF ADMISSION:  07/18/2017  PRIMARY CARE PHYSICIAN: [place], [facility]   REQUESTING/REFERRING PHYSICIAN:   CHIEF COMPLAINT:   Chief Complaint  Patient presents with  . Loss of Consciousness    HISTORY OF PRESENT ILLNESS: Bill [name]  is a 76 y.o. male with a known history per below, had altered mental status at local nursing home, became unresponsive, CPR initiated for 5 minutes, brought to the emergency room for further evaluation/care, patient upon arrival was at neurological baseline, in no apparent distress, no complaints, ER workup noted for potassium 2.5, chloride 100, CT head negative, lactic acid 3.5, EKG noted for right bundle branch block, patient resting comfortably in bed, no family available, noted poor historian due to CVA history with aphasia, patient is now being admitted for acute probable syncopal episode and acute severe hypokalemia with lactic acidosis.  PAST MEDICAL HISTORY:   Past Medical History:  Diagnosis Date  . Aphagia   . HTN (hypertension)   . Sigmoid volvulus (HCC)   . Stroke [facility]) 2002   completely dependent with ADLs/IADLs    PAST SURGICAL HISTORY:  Past Surgical History:  Procedure Laterality Date  . FLEXIBLE SIGMOIDOSCOPY Left 04/14/2016   Procedure: FLEXIBLE SIGMOIDOSCOPY;  Surgeon: Kathi Der, MD;  Location: [facility] END[facility];  Service: Gastroenterology;  Laterality: Left;    SOCIAL HISTORY:  Social History   Tobacco Use  . Smoking status: Never Smoker  . Smokeless tobacco: Never Used  Substance Use Topics  . Alcohol use: No    FAMILY HISTORY:  Family History  Problem Relation Age of Onset  . Stroke Father   . 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 Authorizing Provider  aspirin 81 MG chewable tablet Chew 81 mg by mouth daily. Crush tablet   Yes [provider]  scopolamine (TRANSDERM-SCOP) 1 MG/3DAYS Place 1 patch onto the skin every 3 (three) days.   Yes [provider]  sennosides-docusate sodium (SENOKOT-S) 8.6-50 MG tablet Take 1 tablet by mouth every other day.   Yes [provider]  spironolactone (ALDACTONE) 25 MG tablet Take 12.5 mg by mouth daily.   Yes [provider]  acetaminophen (TYLENOL) 325 MG tablet Take 650 mg by mouth every 6 (six) hours as needed.    [provider]  ondansetron (ZOFRAN) 4 MG tablet Take 1 tablet (4 mg total) by mouth every 6 (six) hours as needed for nausea. Patient not taking: Reported on 02/01/2017 04/23/16   Richarda Overlie, MD      PHYSICAL EXAMINATION:   VITAL SIGNS: Blood pressure 104/77, pulse 77, temperature (!) 97.4 F (36.3 C), temperature source Rectal, resp. rate 14, height 5\' 11"  (1.803 m), weight 45.9 kg (101 lb 1.6 oz), SpO2 100 %.  GENERAL:  76 y.o.-year-old patient lying in the bed with no acute distress.  Frail-appearing, nontoxic  EYES: Pupils equal, round, reactive to light and accommodation. No scleral icterus. Extraocular muscles intact.  HEENT: Head atraumatic, normocephalic. Oropharynx and nasopharynx clear.  Severely dry mucous membranes NECK:  Supple, no jugular venous distention. No thyroid enlargement, no tenderness.  Poor skin turgor LUNGS: Normal breath sounds bilaterally, no  wheezing, rales,rhonchi or crepitation. No use of accessory muscles of respiration.  CARDIOVASCULAR: S1, S2 normal. No murmurs, rubs, or gallops.  ABDOMEN: Soft, nontender, nondistended. Bowel sounds present. No organomegaly or mass.  EXTREMITIES: No pedal edema, cyanosis, or clubbing.  NEUROLOGIC: PERRL, aphasia noted. MAES. Gait not checked.  PSYCHIATRIC: Awake, alert, aphasic   SKIN: No  obvious rash, lesion, or ulcer.   LABORATORY PANEL:   CBC Recent Labs  Lab 07/18/17 1829  WBC 9.6  HGB 14.5  HCT 43.5  PLT 173  MCV 94.9  MCH 31.6  MCHC 33.3  RDW 14.3  LYMPHSABS 1.5  MONOABS 0.6  EOSABS 0.1  BASOSABS 0.0   ------------------------------------------------------------------------------------------------------------------  Chemistries  Recent Labs  Lab 07/18/17 1829  NA 145  K 2.5*  CL 100*  CO2 30  GLUCOSE 143*  BUN 11  CREATININE 0.66  CALCIUM 8.6*  AST 43*  ALT 20  ALKPHOS 72  BILITOT 0.6   ------------------------------------------------------------------------------------------------------------------ estimated creatinine clearance is 50.2 mL/min (by C-G formula based on SCr of 0.66 mg/dL). ------------------------------------------------------------------------------------------------------------------ No results for input(s): TSH, T4TOTAL, T3FREE, THYROIDAB in the last 72 hours.  Invalid input(s): FREET3   Coagulation profile No results for input(s): INR, PROTIME in the last 168 hours. ------------------------------------------------------------------------------------------------------------------- No results for input(s): DDIMER in the last 72 hours. -------------------------------------------------------------------------------------------------------------------  Cardiac Enzymes Recent Labs  Lab 07/18/17 1829  TROPONINI <0.03   ------------------------------------------------------------------------------------------------------------------ Invalid input(s): POCBNP  ---------------------------------------------------------------------------------------------------------------  Urinalysis    Component Value Date/Time   COLORURINE ORANGE (A) 04/13/2016 1933   APPEARANCEUR CLEAR 04/13/2016 1933   LABSPEC 1.031 (H) 04/13/2016 1933   PHURINE 5.0 04/13/2016 1933   GLUCOSEU NEGATIVE 04/13/2016 1933   HGBUR NEGATIVE 04/13/2016  1933   BILIRUBINUR MODERATE (A) 04/13/2016 1933   KETONESUR 15 (A) 04/13/2016 1933   PROTEINUR 30 (A) 04/13/2016 1933   NITRITE POSITIVE (A) 04/13/2016 1933   LEUKOCYTESUR TRACE (A) 04/13/2016 1933     RADIOLOGY: Dg Chest 2 View  Result Date: 07/18/2017 CLINICAL DATA:  Found unresponsive today, history stroke, hypertension EXAM: CHEST  2 VIEW COMPARISON:  02/01/2017 FINDINGS: Normal heart size, mediastinal contours, and pulmonary vascularity. Lungs clear. Question RIGHT nipple shadow. No pleural effusion or pneumothorax. Bowel interposition between liver and diaphragm with scattered gaseous distention of colon. IMPRESSION: Question RIGHT nipple shadow; repeat PA chest radiograph recommended to exclude pulmonary nodule. No acute infiltrate. Gas distention of colon. Electronically Signed   By: Ulyses SouthwardMark  Boles M.D.   On: 07/18/2017 19:32   Ct Head Wo Contrast  Result Date: 07/18/2017 CLINICAL DATA:  Unresponsive earlier today with CPR performed EXAM: CT HEAD WITHOUT CONTRAST TECHNIQUE: Contiguous axial images were obtained from the base of the skull through the vertex without intravenous contrast. COMPARISON:  None. FINDINGS: Brain: There is moderate diffuse atrophy. There is no intracranial mass, hemorrhage, extra-axial fluid collection, or midline shift. There is evidence of a prior infarct involving much of the left occipital lobe with mild sparing antrum medially in the left occipital lobe. There is extensive small vessel disease throughout the centra semiovale bilaterally. There is small vessel disease throughout the internal and external capsules bilaterally. No acute infarct is demonstrable on this study. Vascular: There is no hyperdense vessel. There is calcification in both carotid siphon regions. Skull: The bony calvarium appears intact peer Sinuses/Orbits: There is mucosal thickening in several ethmoid air cells bilaterally. There is a retention cyst in the lateral right sphenoid sinus. Other  visualized paranasal sinuses are clear. Visualized orbits appear symmetric bilaterally. Other: Mastoid  air cells are hypoplastic but clear bilaterally. IMPRESSION: Atrophy with extensive supratentorial small vessel disease. Prior infarct involving much of the left occipital lobe. No acute infarct evident. No evident mass or hemorrhage. There are foci of arteriovascular calcification. There are foci of paranasal sinus disease. Electronically Signed   By: Bretta BangWilliam  Woodruff III M.D.   On: 07/18/2017 19:38    EKG: Orders placed or performed during the hospital encounter of 07/18/17  . ED EKG  . ED EKG  . EKG 12-Lead  . EKG 12-Lead  . EKG 12-Lead  . EKG 12-Lead    IMPRESSION AND PLAN: 1 acute unresponsiveness Most likely secondary to acute syncopal episode, cannot exclude possible TIA Referred to the observation, neuro checks per routine, aspiration/fall/skin care precautions, aspirin daily, rule out acute coronary syndrome with cardiac enzymes x3 sets, check echocardiogram, carotid Dopplers, continue telemetry, IV fluids for rehydration, replete potassium, continue close medical monitoring  2 acute severe hypokalemia Replete with IV fluids, p.o. potassium, check magnesium level, check BMP in the morning  3 acute dehydration IV fluids for rehydration  4 acute hypochloremia Replete with IV fluids and check BMP in the morning  5 chronic CVA with aphasia Continue aspirin, start statin therapy  Full code Condition guarded Prognosis poor DVT prophylaxis with Lovenox subcu Disposition back to nursing home in 1-2 days barring any complications  All the records are reviewed and case discussed with ED provider. Management plans discussed with the patient, family and they are in agreement.  CODE STATUS: Code Status History    Date Active Date Inactive Code Status Order ID Comments User Context   06/15/2017 21:30 06/17/2017 19:26 Full Code [id number]  Shaune Pollackhen, Qing, MD Inpatient   02/01/2017  19:45 02/02/2017 20:00 Full Code [id number]  Oralia ManisWillis, David, MD Inpatient   04/14/2016 01:48 04/23/2016 16:58 Full Code [id number]  Jonah BlueYates, Jennifer, MD Inpatient       TOTAL TIME TAKING CARE OF THIS PATIENT: 40 minutes.    Evelena AsaMontell D Orlen Leedy M.D on 07/18/2017   Between 7am to 6pm - Pager - [phone]  After 6pm go to [email or web address] - Social research officer, governmentpassword [email or web address]  [facility] [place] [facility]  Office  [phone]  CC: Primary care physician; [place], [facility]   Note: This dictation was prepared with [employer] dictation along with smaller Lobbyistphrase technology. Any transcriptional errors that result from this process are unintentional.

## 2017-07-18 NOTE — ED Notes (Signed)
Unable to in-out cath the patient, catheter was not able to be advanced past the prostate. MD notified and verbal order received to put a condom catheter on the patient and wait for him to urinate on his own.

## 2017-07-18 NOTE — ED Notes (Signed)
Lab called to inform that we need someone to come and collect the Lactic acid on the patient. Pharmacy also called to request dextrose 5% in 0.45% NaCl with KCl 340mEq/L.

## 2017-07-18 NOTE — ED Triage Notes (Signed)
[facility] ED [facility]; per EMS, facility staff stated pt had something to eat then was found in his room, unresponsive, at least 5 mins of CPR performed prior to EMS arrival, when EMS on scene, pt's VS WDL with BP 122/60, HR 78, O2 98% on RA, RR 14, CBG 244, CO2 33 and showing Right Bundle Branch Block on EKG; Pt has history of hypokalemia, and dysphagia; per EMS is almost non-verbal but denies being in any pain. At this time, pt is not able to answer any questions including his name, birthday.

## 2017-07-18 NOTE — ED Provider Notes (Signed)
[facility] Emergency Department Provider Note       Time seen: ----------------------------------------- 6:20 PM on 07/18/2017 -----------------------------------------   I have reviewed the triage vital signs and the nursing notes.  HISTORY   Chief Complaint No chief complaint on file.  Level V caveat: History/ROS limited by altered mental status  HPI Bill [name] is a 76 y.o. male with a history of CVA, cardiac arrest who presents to the ED for altered mental status.  Reportedly he was sitting eating dinner when he slumped over.  [facility] and performed approximately 5 minutes of chest compressions.  Upon EMS arrival he was awake and alert and denying any complaints.  He arrives without specific complaints at this time.  Past Medical History:  Diagnosis Date  . Aphagia   . HTN (hypertension)   . Sigmoid volvulus (HCC)   . Stroke [facility]) 2002   completely dependent with ADLs/IADLs    Patient Active Problem List   Diagnosis Date Noted  . Cardiac arrest (HCC) 02/01/2017  . HTN (hypertension) 02/01/2017  . 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 H/O: CVA (cerebrovascular accident) 04/13/2016  . Pulmonary nodule 04/13/2016  . Hypokalemia 04/13/2016    Past Surgical History:  Procedure Laterality Date  . FLEXIBLE SIGMOIDOSCOPY Left 04/14/2016   Procedure: FLEXIBLE SIGMOIDOSCOPY;  Surgeon: Kathi DerParag Brahmbhatt, MD;  Location: [facility] ENDOSCOPY;  Service: Gastroenterology;  Laterality: Left;    Allergies Patient has no known allergies.  Social History Social History   Tobacco Use  . Smoking status: Never Smoker  . Smokeless tobacco: Never Used  Substance Use Topics  . Alcohol use: No  . Drug use: No    Review of Systems Patient has difficulty communicating, no distress noted  All systems negative/normal/unremarkable except as stated in the HPI  ____________________________________________   PHYSICAL EXAM:  VITAL  SIGNS: ED Triage Vitals  Enc Vitals Group     BP      Pulse      Resp      Temp      Temp src      SpO2      Weight      Height      Head Circumference      Peak Flow      Pain Score      Pain Loc      Pain Edu?      Excl. in GC?     Constitutional: Alert, Well appearing and in no distress. Eyes: Conjunctivae are normal. Normal extraocular movements. ENT   Head: Normocephalic and atraumatic.   Nose: No congestion/rhinnorhea.   Mouth/Throat: Mucous membranes are moist.   Neck: No stridor. Cardiovascular: Normal rate, regular rhythm. No murmurs, rubs, or gallops. Respiratory: Normal respiratory effort without tachypnea nor retractions. Breath sounds are clear and equal bilaterally. No wheezes/rales/rhonchi. Gastrointestinal: Soft and nontender. Normal bowel sounds Musculoskeletal: Nontender with normal range of motion in extremities. No lower extremity tenderness nor edema.  There is a reddened area to his central chest Neurologic: There appears to be aphasia, no gross new neuro deficits are appreciated. Skin:  Skin is warm, dry and intact.  Psychiatric: Mood and affect are normal. Speech and behavior are normal.  ____________________________________________  EKG: Interpreted by me.  Sinus rhythm the rate is 64 bpm, right bundle branch block, normal axis, normal QT.  ____________________________________________  ED COURSE:  Pertinent labs & imaging results that were available during my care of the patient were reviewed  by me and considered in my medical decision making (see chart for details). Patient presents for altered mental status with CPR initiated, we will assess with labs and imaging as indicated.   Procedures ____________________________________________   LABS (pertinent positives/negatives)  Labs Reviewed  CBC WITH DIFFERENTIAL/PLATELET - Abnormal; Notable for the following components:      Result Value   Neutro Abs 7.4 (*)    All other  components within normal limits  COMPREHENSIVE METABOLIC PANEL - Abnormal; Notable for the following components:   Potassium 2.5 (*)    Chloride 100 (*)    Glucose, Bld 143 (*)    Calcium 8.6 (*)    AST 43 (*)    All other components within normal limits  LACTIC ACID, PLASMA - Abnormal; Notable for the following components:   Lactic Acid, Venous 3.5 (*)    All other components within normal limits  TROPONIN I  URINALYSIS, COMPLETE (UACMP) WITH MICROSCOPIC  LACTIC ACID, PLASMA   CRITICAL CARE Performed by: Emily FilbertWilliams, Bereket Gernert E   Total critical care time: 30 minutes  Critical care time was exclusive of separately billable procedures and treating other patients.  Critical care was necessary to treat or prevent imminent or life-threatening deterioration.  Critical care was time spent personally by me on the following activities: development of treatment plan with patient and/or surrogate as well as nursing, discussions with consultants, evaluation of patient's response to treatment, examination of patient, obtaining history from patient or surrogate, ordering and performing treatments and interventions, ordering and review of laboratory studies, ordering and review of radiographic studies, pulse oximetry and re-evaluation of patient's condition.  RADIOLOGY Images were viewed by me  Chest x-ray  IMPRESSION: Atrophy with extensive supratentorial small vessel disease. Prior infarct involving much of the left occipital lobe. No acute infarct evident. No evident mass or hemorrhage.  There are foci of arteriovascular calcification. There are foci of paranasal sinus disease. IMPRESSION: Question RIGHT nipple shadow; repeat PA chest radiograph recommended to exclude pulmonary nodule.  No acute infiltrate.  Gas distention of colon. ____________________________________________  DIFFERENTIAL DIAGNOSIS   Syncope, dehydration, electrolyte abnormality, arrhythmia, MI, CVA  FINAL  ASSESSMENT AND PLAN  Altered mental status, lactic acidosis, hypokalemia   Plan: Patient had presented for altered mental status and loss of consciousness with reported prehospital arrest and CPR. Patient's labs did reveal lactic acidosis as well as hypokalemia of uncertain etiology.  We have started repleting the potassium with IV D5 half-normal saline with 40 mg once of KCl.  Patient's imaging did not reveal any acute process.  Urinalysis still pending, I will discuss with the hospitalist for admission.  He needs telemetry with repeat lactic acid level, we also need to make sure his potassium levels normalized.   Emily FilbertWilliams, Gricel Copen E, MD   Note: This note was generated in part or whole with voice recognition software. Voice recognition is usually quite accurate but there are transcription errors that can and very often do occur. I apologize for any typographical errors that were not detected and corrected.     Emily FilbertWilliams, Nuno Brubacher E, MD 07/18/17 2123

## 2017-07-19 ENCOUNTER — Observation Stay: Payer: Medicare Other

## 2017-07-19 ENCOUNTER — Observation Stay
Admit: 2017-07-19 | Discharge: 2017-07-19 | Disposition: A | Discharge status: Home / Self Care | Payer: Medicare Other | Attending: Family Medicine | Admitting: Family Medicine

## 2017-07-19 DIAGNOSIS — L8990 Pressure ulcer of unspecified site, unspecified stage: Secondary | ICD-10-CM

## 2017-07-19 LAB — BASIC METABOLIC PANEL
ANION GAP: 11 (ref 5–15)
ANION GAP: 14 (ref 5–15)
BUN: 12 mg/dL (ref 6–20)
BUN: 9 mg/dL (ref 6–20)
CALCIUM: 8.4 mg/dL — AB (ref 8.9–10.3)
CHLORIDE: 97 mmol/L — AB (ref 101–111)
CO2: 34 mmol/L — ABNORMAL HIGH (ref 22–32)
CO2: 35 mmol/L — AB (ref 22–32)
CREATININE: 0.57 mg/dL — AB (ref 0.61–1.24)
Calcium: 8.7 mg/dL — ABNORMAL LOW (ref 8.9–10.3)
Chloride: 101 mmol/L (ref 101–111)
Creatinine, Ser: 0.45 mg/dL — ABNORMAL LOW (ref 0.61–1.24)
GFR calc Af Amer: >60 mL/min (ref >60)
GFR calc Af Amer: >60 mL/min (ref >60)
GFR calc non Af Amer: >60 mL/min (ref >60)
GLUCOSE: 115 mg/dL — AB (ref 65–99)
GLUCOSE: 138 mg/dL — AB (ref 65–99)
Potassium: 2.6 mmol/L — CL (ref 3.5–5.1)
Potassium: 3.6 mmol/L (ref 3.5–5.1)
Sodium: 146 mmol/L — ABNORMAL HIGH (ref 135–145)
Sodium: 146 mmol/L — ABNORMAL HIGH (ref 135–145)

## 2017-07-19 LAB — ECHOCARDIOGRAM COMPLETE
Area-P 1/2: 2.62 cm2
CHL CUP MV DEC (S): 285
E decel time: 285 msec
EERAT: 7.35
FS: 36 % (ref 28–44)
Height: 71 in
IV/PV OW: 0.77
LA ID, A-P, ES: 20 mm
LA diam end sys: 20 mm
LA diam index: 1.32 cm/m2
LV E/e' medial: 7.35
LV E/e'average: 7.35
LVELAT: 6.2 cm/s
Lateral S' vel: 10.3 cm/s
MVPKAVEL: 67.4 m/s
MVPKEVEL: 45.6 m/s
P 1/2 time: 84 ms
PW: 9.64 mm — AB (ref 0.6–1.1)
TDI e' lateral: 6.2
TDI e' medial: 4.57
Weight: 1660.8 oz

## 2017-07-19 LAB — URINALYSIS, COMPLETE (UACMP) WITH MICROSCOPIC
BILIRUBIN URINE: NEGATIVE
Bacteria, UA: NONE SEEN
Glucose, UA: NEGATIVE mg/dL
KETONES UR: NEGATIVE mg/dL
Nitrite: NEGATIVE
PROTEIN: 100 mg/dL — AB
Specific Gravity, Urine: 1.018 (ref 1.005–1.030)
pH: 7 (ref 5.0–8.0)

## 2017-07-19 LAB — MAGNESIUM: Magnesium: 1.7 mg/dL (ref 1.7–2.4)

## 2017-07-19 LAB — MRSA PCR SCREENING: MRSA by PCR: NEGATIVE

## 2017-07-19 LAB — LACTIC ACID, PLASMA
Lactic Acid, Venous: 2.1 mmol/L (ref 0.5–1.9)
Lactic Acid, Venous: 2.8 mmol/L (ref 0.5–1.9)

## 2017-07-19 NOTE — Progress Notes (Signed)
Initial Nutrition Assessment  DOCUMENTATION CODES:   Severe malnutrition in context of chronic illness, Underweight  INTERVENTION:  Will make recommendation for appropriate oral nutrition supplement pending SLP evaluation.  NUTRITION DIAGNOSIS:   Severe Malnutrition related to chronic illness(hx CVA, dysphagia, sigmoid volvulus) as evidenced by 20.4% weight loss over 10 months, severe fat depletion.  GOAL:   Patient will meet greater than or equal to 90% of their needs  MONITOR:   PO intake, Supplement acceptance, Diet advancement, Labs, Weight trends, Skin, I & O's  REASON FOR ASSESSMENT:   Consult Assessment of nutrition requirement/status  ASSESSMENT:   76 year old male with PMHx of HTN, sigmoid volvulus, CVA 2002 who is now completely dependent with ADLs, aphasia who presented from [facility] after being found unresponsive in his room. Patient received 5 minutes of CPR before EMS arrived. Now admitted with acute unresponsiveness, acute severe hypokalemia, acute dehydration.   -Patient is NPO until SLP evaluation tomorrow.  Met with patient at bedside. He is alert but unable to provide history in setting of aphasia. Able to nod yes or no to questions usually per chart, but unable to answer any questions for this RD. No family members at bedside. RN reports patient enjoys foods such as applesauce and chocolate pudding. He is typically on dysphagia 3 diet at [facility] (unsure of liquid consistency). Per previous RD note patient was on honey-thick liquids back in 01/2017. Patient is bed-bound and contracted at baseline.  Per review of weight history in chart patient was 130.7 lbs on 04/14/2016. That was the admission where his sigmoid volvulus was discovered but family declined surgery. It appears he discharge home with home health and was a participant in [place] program at that time. He was 104.1 lbs on 02/01/2017. He lost 26.6 lbs (20.4% body weight) over 10 months, which is  significant for time frame.  Medications reviewed and include: senna, D5-1/2NS with KCl 40 mEq/L at 75 mL/hr (90 grams dextrose, 306 kcal daily).  Labs reviewed: Sodium 146, CO2 34, Creatinine 0.45.  NUTRITION - FOCUSED PHYSICAL EXAM:    Most Recent Value  Orbital Region  Severe depletion  Upper Arm Region  Severe depletion  Thoracic and Lumbar Region  Severe depletion  Buccal Region  Severe depletion  Temple Region  Severe depletion  Clavicle Bone Region  Severe depletion  Clavicle and Acromion Bone Region  Severe depletion  Scapular Bone Region  Severe depletion  Dorsal Hand  Severe depletion  Patellar Region  Severe depletion  Anterior Thigh Region  Severe depletion  Posterior Calf Region  Severe depletion  Edema (RD Assessment)  Mild  Hair  Reviewed  Eyes  Unable to assess  Mouth  Unable to assess  Skin  Reviewed  Nails  Reviewed     Severity of muscle wasting expected in bilateral upper and lower extremities as patient is bed-bound and contracted at baseline. However, limited mobility does not explain level of muscle wasting found on temples or severity of fat wasting.  Diet Order:  Diet NPO time specified  EDUCATION NEEDS:   Not appropriate for education at this time  Skin:  Skin Assessment: Skin Integrity Issues: Skin Integrity Issues:: Stage I, Other (Comment) Stage I: sacrum Other: redness to bilateral heels  Last BM:  07/19/2017 - medium type 6  Height:   Ht Readings from Last 1 Encounters:  07/18/17 5' 11"  (1.803 m)    Weight:   Wt Readings from Last 1 Encounters:  07/19/17 103  lb 12.8 oz (47.1 kg)    Ideal Body Weight:  78.2 kg  BMI:  Body mass index is 14.48 kg/m.  Estimated Nutritional Needs:   Kcal:  1415-1650 (30-35 kcal/kg)  Protein:  70-80 grams (1.5-1.7 grams/kg)  Fluid:  1.4-1.7 L/day (30-35 mL/kg)  Willey Blade, MS, RD, LDN Office: [phone] Pager: [phone] After Hours/Weekend Pager: [phone]

## 2017-07-19 NOTE — Progress Notes (Signed)
The patient is admitted to room 240 with the diagnosis of Syncope.  Alert but unable to assess his orientation due to aphagia. No s/s of pain noted or any acute distress. Skin assessment done with Crystal B. RN, noted stage 1 on the sacrum and redness on bilateral heels. Admission questionnaire not completed as a result of aphagia and will be communicated to the  incoming RN to complete when the family gets to the bedside. Bed alarm activated and the bed is in the lowest position. Will continue to monitor.

## 2017-07-19 NOTE — Progress Notes (Addendum)
Patient  was  Coughing and gurgling during the second dose of Potassium oral. Dr.Pyreddy notified with a new order for Speech consult and NPO order. No acute distress noted.  Patient hasn't urinated since admitted. Condom cath in place. Bladder scan read 47 mL. Will continue to monitor.

## 2017-07-19 NOTE — Progress Notes (Signed)
Patient is sating in the 80s on R/A. 02 at 2L/Raymond applied and patient sat increased to 96 %. No acute distress noted. Will continue to monitor.

## 2017-07-19 NOTE — Progress Notes (Signed)
[employer] - [place] [facility]   PATIENT NAME: Bill [name]    MR#:  [id number]  DATE OF BIRTH:  03/11/1941  SUBJECTIVE:  CHIEF COMPLAINT:   Chief Complaint  Patient presents with  . Loss of Consciousness   The patient has aphasia, noncommunicative. Per RN, Coughing and gurgling during the second dose of Potassium oral. Now NPO. REVIEW OF SYSTEMS:  Review of Systems  Unable to perform ROS: Medical condition    DRUG ALLERGIES:  No Known Allergies VITALS:  Blood pressure (!) 121/53, pulse 77, temperature 98.1 F (36.7 C), temperature source Oral, resp. rate 18, height 5\' 11"  (1.803 m), weight 103 lb 12.8 oz (47.1 kg), SpO2 93 %. PHYSICAL EXAMINATION:  Physical Exam  Constitutional:  Severe malnutrition.  HENT:  Head: Normocephalic.  Eyes: Conjunctivae and EOM are normal. Pupils are equal, round, and reactive to light. No scleral icterus.  Neck: Normal range of motion. Neck supple. No JVD present. No tracheal deviation present.  Cardiovascular: Normal rate, regular rhythm and normal heart sounds. Exam reveals no gallop.  No murmur heard. Pulmonary/Chest: Effort normal and breath sounds normal. No respiratory distress. He has no wheezes. He has no rales.  Abdominal: Soft. Bowel sounds are normal. He exhibits no distension. There is no tenderness. There is no rebound.  Musculoskeletal: He exhibits no edema or tenderness.  Bedbound.  Unable to move lower extremities.  Sacral DU.  Neurological: He is alert.  Skin: No rash noted. No erythema.   LABORATORY PANEL:  Male CBC Recent Labs  Lab 07/18/17 1829  WBC 9.6  HGB 14.5  HCT 43.5  PLT 173   ------------------------------------------------------------------------------------------------------------------ Chemistries  Recent Labs  Lab 07/18/17 1829 07/19/17 0006 07/19/17 0856  NA 145 146* 146*  K 2.5* 2.6* 3.6  CL 100* 97* 101  CO2 30 35* 34*  GLUCOSE 143* 138* 115*  BUN 11 12 9     CREATININE 0.66 0.57* 0.45*  CALCIUM 8.6* 8.7* 8.4*  MG  --  1.7  --   AST 43*  --   --   ALT 20  --   --   ALKPHOS 72  --   --   BILITOT 0.6  --   --    RADIOLOGY:  Dg Chest 2 View  Result Date: 07/18/2017 CLINICAL DATA:  Found unresponsive today, history stroke, hypertension EXAM: CHEST  2 VIEW COMPARISON:  02/01/2017 FINDINGS: Normal heart size, mediastinal contours, and pulmonary vascularity. Lungs clear. Question RIGHT nipple shadow. No pleural effusion or pneumothorax. Bowel interposition between liver and diaphragm with scattered gaseous distention of colon. IMPRESSION: Question RIGHT nipple shadow; repeat PA chest radiograph recommended to exclude pulmonary nodule. No acute infiltrate. Gas distention of colon. Electronically Signed   By: Ulyses SouthwardMark  Boles M.D.   On: 07/18/2017 19:32   Ct Head Wo Contrast  Result Date: 07/18/2017 CLINICAL DATA:  Unresponsive earlier today with CPR performed EXAM: CT HEAD WITHOUT CONTRAST TECHNIQUE: Contiguous axial images were obtained from the base of the skull through the vertex without intravenous contrast. COMPARISON:  None. FINDINGS: Brain: There is moderate diffuse atrophy. There is no intracranial mass, hemorrhage, extra-axial fluid collection, or midline shift. There is evidence of a prior infarct involving much of the left occipital lobe with mild sparing antrum medially in the left occipital lobe. There is extensive small vessel disease throughout the centra semiovale bilaterally. There is small vessel disease throughout the internal and external capsules bilaterally. No acute infarct is demonstrable on this study. Vascular:  There is no hyperdense vessel. There is calcification in both carotid siphon regions. Skull: The bony calvarium appears intact peer Sinuses/Orbits: There is mucosal thickening in several ethmoid air cells bilaterally. There is a retention cyst in the lateral right sphenoid sinus. Other visualized paranasal sinuses are clear.  Visualized orbits appear symmetric bilaterally. Other: Mastoid air cells are hypoplastic but clear bilaterally. IMPRESSION: Atrophy with extensive supratentorial small vessel disease. Prior infarct involving much of the left occipital lobe. No acute infarct evident. No evident mass or hemorrhage. There are foci of arteriovascular calcification. There are foci of paranasal sinus disease. Electronically Signed   By: Bretta BangWilliam  Woodruff III M.D.   On: 07/18/2017 19:38   ASSESSMENT AND PLAN:   1 acute unresponsiveness Most likely secondary to acute syncopal episode, cannot exclude possible TIA neuro checks per routine, aspiration/fall/skin care precautions, aspirin daily,no acute coronary syndrome. echocardiogram, carotid Dopplers, continue telemetry, IV fluids for rehydration.  2 acute severe hypokalemia Improved with with IV and p.o. Potassium.  3 acute dehydration IV fluids for rehydration  4 acute hypochloremia Improved with IV NS.  5 chronic CVA with aphasia Continue aspirin and statin therapy  Lactic acidosis.  Improving. Aphasia.  The patient has choking.  Keep n.p.o. and speech study.  Severe malnutrition.  Dietitian consult.  All the records are reviewed and case discussed with Care Management/Social Worker. Management plans discussed with the patient, family and they are in agreement.  CODE STATUS: Full Code  TOTAL TIME TAKING CARE OF THIS PATIENT: 33 minutes.   More than 50% of the time was spent in counseling/coordination of care: YES  POSSIBLE D/C IN 1-2 DAYS, DEPENDING ON CLINICAL CONDITION.   Shaune PollackQing Luismanuel Corman M.D on 07/19/2017 at 11:32 AM  Between 7am to 6pm - Pager - [phone]  After 6pm go to [email or web address] - Therapist, nutritionalpassword EPAS [facility]  [facility] [place] [facility]

## 2017-07-19 NOTE — Progress Notes (Signed)
Pt son came to visit for a short time, when asked about patients baseline status he stated that the patient was performing at baseline, wife is better historian however unable to make it today. Son states that pt enjoys applesauce and chocolate pudding when at the nursing home.

## 2017-07-20 LAB — BASIC METABOLIC PANEL
Anion gap: 7 (ref 5–15)
BUN: 7 mg/dL (ref 6–20)
CALCIUM: 8.3 mg/dL — AB (ref 8.9–10.3)
CHLORIDE: 105 mmol/L (ref 101–111)
CO2: 34 mmol/L — AB (ref 22–32)
CREATININE: 0.39 mg/dL — AB (ref 0.61–1.24)
GFR calc Af Amer: >60 mL/min (ref >60)
GFR calc non Af Amer: >60 mL/min (ref >60)
GLUCOSE: 90 mg/dL (ref 65–99)
Potassium: 3.5 mmol/L (ref 3.5–5.1)
Sodium: 146 mmol/L — ABNORMAL HIGH (ref 135–145)

## 2017-07-20 LAB — LACTIC ACID, PLASMA: Lactic Acid, Venous: 1.2 mmol/L (ref 0.5–1.9)

## 2017-07-20 MED ORDER — ATORVASTATIN CALCIUM 20 MG PO TABS: 20.0000 mg | ORAL_TABLET | Freq: Every day | ORAL | Status: AC

## 2017-07-20 NOTE — Care Management Obs Status (Signed)
MEDICARE OBSERVATION STATUS NOTIFICATION   Patient Details  Name: Bill [name] MRN: [id number] Date of Birth: 06/03/1941   Medicare Observation Status Notification Given:  Yes patient is not able to understand or sign notice.  CM contacted patient's wife and explained notice.  She declined having any questions.  She is not able to travel to hospital to sign the notice due to inclement wheather   Eber HongGreene, Theoren Palka R, RN 07/20/2017, 10:23 AM

## 2017-07-20 NOTE — Progress Notes (Signed)
[facility] VSS  BP 118/70, MAP 83, HR 69, 98% on Room air.

## 2017-07-20 NOTE — Progress Notes (Signed)
Advanced Care Plan.  Purpose of Encounter: Code status. Parties in Attendance: the patient, his wife and me. Patient's Decisional Capacity: No. /Medical Story:  Bill [name]  is a 76 y.o. male with a known history per below, had altered mental status at local nursing home, became unresponsive, CPR initiated for 5 minutes, brought to the emergency room for further evaluation/care, patient upon arrival was at neurological baseline. echocardiogram and carotid Dopplers are unremarkable. acute severe hypokalemia improved withwith IV andp.o. Potassium. He has high risk of aspiration and choking. I discussed with his wife about condition and code status. She wants patient in FULL CODE. Plan:  Code Status: FULL CODE. Time spent discussing advance care planning: 18 minutes.

## 2017-07-20 NOTE — Clinical Social Work Note (Signed)
Clinical Social Work Assessment  Patient Details  Name: Bill LentoWilliam M [name] MRN: [id number] Date of Birth: 12/16/1940  Date of referral:  07/20/17               Reason for consult:  Discharge Planning                Permission sought to share information with:    Permission granted to share information::     Name::        Agency::     Relationship::     Contact Information:     Housing/Transportation Living arrangements for the past 2 months:    Source of Information:  Spouse Patient Interpreter Needed:  None Criminal Activity/Legal Involvement Pertinent to Current Situation/Hospitalization:  No - Comment as needed Significant Relationships:  Spouse Lives with:  Facility Resident Do you feel safe going back to the place where you live?  Yes Need for family participation in patient care:  Yes (Comment)  Care giving concerns:  None, [facility].   Social Worker assessment / plan:  MD is discharging patient today. [facility] and [facility] EMS to transport. CSW spoke with patient's wife and she in agreement with [facility].  Employment status:  Retired Health and safety inspectornsurance information:  Armed forces operational officerMedicare, [place] Recommendations:  Skilled Nursing Facility Information / Referral to community resources:     Patient/Family's Response to care:  Patient's wife expressed appreciation for CSW assistance.  Patient/Family's Understanding of and Emotional Response to Diagnosis, Current Treatment, and Prognosis:  Patient's wife is involved in patient's discharge plan.   Emotional Assessment Appearance:  Appears stated age Attitude/Demeanor/Rapport:    Affect (typically observed):    Orientation:    Alcohol / Substance use:  Not Applicable Psych involvement (Current and /or in the community):  No (Comment)  Discharge Needs  Concerns to be addressed:  Care  Coordination Readmission within the last 30 days:  No Current discharge risk:  None Barriers to Discharge:  No Barriers Identified   Bill SpanielMonica Satoya Feeley, LCSW 07/20/2017, 11:27 AM

## 2017-07-20 NOTE — Progress Notes (Signed)
Nutrition Brief Follow-Up Note  Full RD assessment yesterday. Noted patient has now been advanced to dysphagia 1 diet with Nectar-thick liquids.  Recommend Magic cup TID with meals, each supplement provides 290 kcal and 9 grams of protein.  Recommend Hormel Shake BID with lunch and dinner, each supplement provides 520 kcal, 22 grams of protein, and is nectar-thick.  Bill RimaLeanne Reice [place], MS, RD, LDN Office: [phone] Pager: [phone] After Hours/Weekend Pager: [phone]

## 2017-07-20 NOTE — Care Management (Signed)
Patient placed in observation after [facility].  Wife says he has been at facility for about 4-5 weeks because he lost his aide and she could not meet his care needs. Patient to discharge back to facility per wife.  Notified CSW

## 2017-07-20 NOTE — Evaluation (Signed)
Clinical/Bedside Swallow Evaluation Patient Details  Name: Bill [name] MRN: [id number] Date of Birth: 1940-12-21  Today's Date: 07/20/2017 Time: SLP Start Time (ACUTE ONLY): 1100 SLP Stop Time (ACUTE ONLY): 1200 SLP Time Calculation (min) (ACUTE ONLY): 60 min  Past Medical History:  Past Medical History:  Diagnosis Date  . Aphagia   . HTN (hypertension)   . Sigmoid volvulus (HCC)   . Stroke [facility]) 2002   completely dependent with ADLs/IADLs   Past Surgical History:  Past Surgical History:  Procedure Laterality Date  . FLEXIBLE SIGMOIDOSCOPY Left 04/14/2016   Procedure: FLEXIBLE SIGMOIDOSCOPY;  Surgeon: Kathi Der, MD;  Location: [facility] ENDOSCOPY;  Service: Gastroenterology;  Laterality: Left;   HPI:  Pt is a 76 y.o. male with a known history of sigmoid volvulus, hypertension, pulmonary nodule, cardiac arrest, CVAs, hypokalemia. The patient is sent from skilled nursing facility to the ED. The patient is nonverbal and noncommunicative, he is bedbound per report. Per chart notes, pt had abdominal pain and distention in the nursing home. x-ray was done of his abdomen suspicious for obstruction and volvulus(pt has had a similar issue in the past) most recent admission in 11/18. Pt underwent a decompressive sigmoidoscopy for sigmoid volvulus. In 2014, pt was seen by ST services at [facility]. Pt had hx of CVA years prior w/ resulting dysphagia and aphasia. He reported trouble swallowing, trouble swallowing pills, and did have a h/o pneumonia dx. A MBSS was performed w/ dx'd Oropharyngeal phase dysphagia and recommendations for a Pureed diet w/ Thin liquids w/ Strict Aspiration precautions - increased risk for aspiration, per report then. Pt lived with his wife, who takes care of him along with home health aides but apparently was at a SNF d/t increased need of care. 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 He apparently eats a puree/mashed soft diet w/ thin liquids which family members feed to him as he is unable to feed himself. He was recommended to be on a Dysphagia level 1 w/ thin liquids during recent admission. This admission, pt had altered mental status at local nursing home, became unresponsive, CPR initiated for 5 minutes, brought to the emergency room for further evaluation/care, patient upon arrival was at neurological baseline, in no apparent distress, no complaints, ER workup noted for potassium 2.5, chloride 100, CT head negative, lactic acid 3.5, EKG noted for right bundle branch block, patient resting comfortably in bed, no family available, noted poor historian due to CVA history with aphasia, patient is now being admitted for acute probable syncopal episode, dehydration, and acute severe hypokalemia with lactic acidosis.    Assessment / Plan / Recommendation Clinical Impression  Pt appears to present w/ moderate-severe oropharyngeal dysphagia w/ moderate risk for aspiration as well as risk for inability to fully meet hydration(admitted w/ dehydration) and nutritional needs adequately. Pt's past med hx includes multiple CVAs resulting in late effects of dysphagia and profound expressive aphasia(receptive?), per chart review from [facility]. In 2014, a MBSS was performed identifying oropharyngeal phase dysphagia w/ increased risk for aspiration then. Pt was recommended to be on a Pureed diet(Dysphagia level 1) w/ Thin liquids w/ Aspiration Precautions; pt required 100% feeding assistance at all meals then, and now, d/t debilitation. He resides at [facility] d/t debilitation and level of care. Pt communicates nonverbally through head nods and phonations. There  is inconsistency w/ follow through w/ tasks; unsure of level of Cognitive status along w/ his Aphasia. Pt was given trials of Nectar, Honey liquids via  Spoon placing boluses anteriorly in oral cavity, and purees, during this evaluation - no thin liquids were assessed d/t concern for aspiration. During trials, pt exhibited lack of bolus awareness/control w/ decreased lingual movements for bolus manipulation and A-P transfer and reduced labial closure to prevent bolus spillage/leakage anteriorly. Pt exhibited a delayed, mild throat clearing response x1 during trials of thickened liquids; no decline in respiratory status was noted(O2 sats remained upper 90's), and vocal quality did not become overly wet and gurgly during phonations. During trials of puree, pt again exhibited lack of oral control w/ decreased labial seal around spoon then oral holding w/ prolonged A-P transfer b/f swallowing. Pt then exhibited hard, audible and multiple swallows w/ all trials. Suspect delayed pharyngeal swallow initiation w/ all trials and premature spillage of bolus material into the pharynx - this increases risk for aspiration to occur. Pt requires feeding and strict aspiration precautions w/ the feeding; time b/t each bolus to allow for full complete swallowing and clearing. Again, pt is at increased risk for aspiration as well as poor ability to meet full nutrition and hydration needs d/t his degree of oropharyngeal phase dysphagia. This was discussed w/ MD and a Palliative Care consult was recommended to address goals of care re: his oropharyngeal phase dysphagia. MD agreed. NSG/CM updated.  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)    Aspiration Risk  Moderate aspiration risk    Diet Recommendation  Dysphagia level 1(puree) w/ Nectar consistency liquids via TSP only; strict aspiration precautions and feeding support; monitor for s/s of aspiration and/or decline in pulmonary status  Medication Administration: Crushed with puree    Other  Recommendations Recommended Consults: (Dietician and Palliative Care consults) Oral Care Recommendations: Oral care  BID;Staff/trained caregiver to provide oral care Other Recommendations: Order thickener from pharmacy;Prohibited food (jello, ice cream, thin soups);Remove water pitcher;Have oral suction available   Follow up Recommendations Skilled Nursing facility      Frequency and Duration (TBD)  (TBD)       Prognosis Prognosis for Safe Diet Advancement: Guarded Barriers to Reach Goals: Cognitive deficits;Language deficits;Time post onset;Severity of deficits      Swallow Study   General Date of Onset: 07/18/17 HPI: Pt is a 76 y.o. male with a known history of sigmoid volvulus, hypertension, pulmonary nodule, cardiac arrest, CVAs, hypokalemia. The patient is sent from skilled nursing facility to the ED. The patient is nonverbal and noncommunicative, he is bedbound per report. Per chart notes, pt had abdominal pain and distention in the nursing home. x-ray was done of his abdomen suspicious for obstruction and volvulus(pt has had a similar issue in the past) most recent admission in 11/18. Pt underwent a decompressive sigmoidoscopy for sigmoid volvulus. In 2014, [facility] Pt had hx of CVA years prior w/ resulting dysphagia and aphasia. He reported trouble swallowing, trouble swallowing pills, and did have a h/o pneumonia dx. A MBSS was performed w/ dx'd Oropharyngeal phase dysphagia and recommendations for a Pureed diet w/ Thin liquids w/ Strict Aspiration precautions - increased risk for aspiration, per report then. Pt lived with his wife, who takes care of him along with home health aides but apparently was at a SNF d/t increased need of care. 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 He apparently eats a puree/mashed soft diet w/ thin liquids which family members feed to him as he is unable to feed himself. He was  recommended to be on a Dysphagia level 1 w/ thin liquids during recent admission. This admission, pt had altered mental status at local nursing home, became unresponsive, CPR initiated for 5 minutes, brought to the emergency room for further evaluation/care, patient upon arrival was at neurological baseline, in no apparent distress, no complaints, ER workup noted for potassium 2.5, chloride 100, CT head negative, lactic acid 3.5, EKG noted for right bundle branch block, patient resting comfortably in bed, no family available, noted poor historian due to CVA history with aphasia, patient is now being admitted for acute probable syncopal episode, dehydration, and acute severe hypokalemia with lactic acidosis.  Type of Study: Bedside Swallow Evaluation Previous Swallow Assessment: 11/18; previous MBSS at [facility] yrs ago Diet Prior to this Study: Thin liquids;Dysphagia 1 (puree) Temperature Spikes Noted: No(wbc 9.6) Respiratory Status: Nasal cannula(1 liter) History of Recent Intubation: No Behavior/Cognition: Alert;Cooperative;Pleasant mood;Distractible;Requires cueing(phonations to communicate) Oral Cavity Assessment: Dry Oral Care Completed by SLP: Recent completion by staff Oral Cavity - Dentition: Edentulous Vision: (n/a) Self-Feeding Abilities: Total assist(does not appear to use UEs) Patient Positioning: Upright in bed Baseline Vocal Quality: (phonations adequate) Volitional Cough: Cognitively unable to elicit Volitional Swallow: Unable to elicit    Oral/Motor/Sensory Function Overall Oral Motor/Sensory Function: Moderate impairment(did not fully follow through w/ OM tasks - open mouth) Facial Symmetry: Within Functional Limits Facial Strength: (open mouth posture primarily) Lingual Symmetry: Within Functional Limits Lingual Strength: Within Functional Limits(posterior especially)   Ice Chips Ice chips: Not tested   Thin Liquid Thin Liquid: Not tested    Nectar Thick Nectar Thick Liquid:  Impaired Presentation: Spoon(fed; 10 trials total) Oral Phase Impairments: Poor awareness of bolus;Reduced lingual movement/coordination;Reduced labial seal Oral phase functional implications: Oral residue;Prolonged oral transit(spillage) Pharyngeal Phase Impairments: Suspected delayed Swallow;Multiple swallows;Throat Clearing - Delayed(audible swallows) Other Comments: throat clear x1   Honey Thick Honey Thick Liquid: Impaired Presentation: Spoon(fed; 3 trials) Oral Phase Impairments: Reduced labial seal;Reduced lingual movement/coordination;Poor awareness of bolus Oral Phase Functional Implications: Oral holding;Prolonged oral transit(spillage) Pharyngeal Phase Impairments: Throat Clearing - Delayed;Suspected delayed Swallow;Multiple swallows(x1; and audible swallows) Other Comments: similar to Nectar trials   Puree Puree: Impaired Presentation: Spoon(fed; 6 trials) Oral Phase Impairments: Reduced labial seal;Reduced lingual movement/coordination;Poor awareness of bolus Oral Phase Functional Implications: Prolonged oral transit;Oral residue(spillage) Pharyngeal Phase Impairments: (none)   Solid   GO   Solid: Not tested    Functional Assessment Tool Used: clinical judgement Functional Limitations: Swallowing Swallow Current Status ([id number]): At least 60 percent but less than 80 percent impaired, limited or restricted Swallow Goal Status [phone]): At least 60 percent but less than 80 percent impaired, limited or restricted Swallow Discharge Status [phone]([id number]): At least 60 percent but less than 80 percent impaired, limited or restricted    Jerilynn SomKatherine Watson, MS, CCC-SLP Watson,Katherine 07/20/2017,5:36 PM

## 2017-07-20 NOTE — Discharge Summary (Addendum)
[employer] - [place] at [facility]   PATIENT NAME: Bill [name]    MR#:  [id number]  DATE OF BIRTH:  11/12/1940  DATE OF ADMISSION:  07/18/2017   ADMITTING PHYSICIAN: Bertrum SolMontell D Salary, MD  DATE OF DISCHARGE: 07/20/2017  PRIMARY CARE PHYSICIAN: [place], [facility]   ADMISSION DIAGNOSIS:  Syncope and collapse [R55] Syncope [R55] DISCHARGE DIAGNOSIS:  Active Problems:   Syncope   Pressure injury of skin  SECONDARY DIAGNOSIS:   Past Medical History:  Diagnosis Date  . Aphagia   . HTN (hypertension)   . Sigmoid volvulus (HCC)   . Stroke [facility](HCC) 2002   completely dependent with ADLs/IADLs   HOSPITAL COURSE:   1acute unresponsiveness Most likely secondary to acute syncopal episode, cannot exclude possible TIA neuro checks per routine, aspiration/fall/skin care precautions, aspirin daily,no acute coronary syndrome. echocardiogram and carotid Dopplers are unremarkable. Continue ASA and Lipitor.  2acute severe hypokalemia Improved with with IV and p.o. Potassium.  3acute dehydration Improved with IV fluids for rehydration  Hold spironolactone.  4acute hypochloremia Improved with IV NS.  5chronic CVA with aphasia Continue aspirin and statin therapy  Lactic acidosis.  Improving. Aphasia.  The patient has choking.  speech study: Puree diet with thick nectar. Strict aspiration precaution.  Severe malnutrition.  follow Dietitian's recommendation. sacral DU stage 2. Nursing care.  Palliative care consult in SNF.  DISCHARGE CONDITIONS:  Stable, discharge to SNF today. CONSULTS OBTAINED:   DRUG ALLERGIES:  No Known Allergies DISCHARGE MEDICATIONS:   Allergies as of 07/20/2017   No Known Allergies     Medication List    STOP taking these medications   spironolactone 25 MG tablet Commonly known as:  ALDACTONE     TAKE these medications   acetaminophen 325 MG tablet Commonly known as:  TYLENOL Take 650 mg by mouth every  6 (six) hours as needed.   aspirin 81 MG chewable tablet Chew 81 mg by mouth daily. Crush tablet   atorvastatin 20 MG tablet Commonly known as:  LIPITOR Take 1 tablet (20 mg total) by mouth daily at 6 PM.   ondansetron 4 MG tablet Commonly known as:  ZOFRAN Take 1 tablet (4 mg total) by mouth every 6 (six) hours as needed for nausea.   scopolamine 1 MG/3DAYS Commonly known as:  TRANSDERM-SCOP Place 1 patch onto the skin every 3 (three) days.   sennosides-docusate sodium 8.6-50 MG tablet Commonly known as:  SENOKOT-S Take 1 tablet by mouth every other day.        DISCHARGE INSTRUCTIONS:  See AVS.  If you experience worsening of your admission symptoms, develop shortness of breath, life threatening emergency, suicidal or homicidal thoughts you must seek medical attention immediately by calling 911 or calling your MD immediately  if symptoms less severe.  You Must read complete instructions/literature along with all the possible adverse reactions/side effects for all the Medicines you take and that have been prescribed to you. Take any new Medicines after you have completely understood and accpet all the possible adverse reactions/side effects.   Please note  You were cared for by a hospitalist during your hospital stay. If you have any questions about your discharge medications or the care you received while you were in the hospital after you are discharged, you can call the unit and asked to speak with the hospitalist on call if the hospitalist that took care of you is not available. Once you are discharged, your primary care physician will handle any  further medical issues. Please note that NO REFILLS for any discharge medications will be authorized once you are discharged, as it is imperative that you return to your primary care physician (or establish a relationship with a primary care physician if you do not have one) for your aftercare needs so that they can reassess your need  for medications and monitor your lab values.    On the day of Discharge:  VITAL SIGNS:  Blood pressure (!) 102/43, pulse (!) 129, temperature 97.6 F (36.4 C), temperature source Oral, resp. rate 18, height 5\' 11"  (1.803 m), weight 103 lb 12.8 oz (47.1 kg), SpO2 (!) 88 %. PHYSICAL EXAMINATION:  GENERAL:  76 y.o.-year-old patient lying in the bed with no acute distress.  EYES: Pupils equal, round, reactive to light and accommodation. No scleral icterus. Extraocular muscles intact.  HEENT: Head atraumatic, normocephalic. Oropharynx and nasopharynx clear.  NECK:  Supple, no jugular venous distention. No thyroid enlargement, no tenderness.  LUNGS: Normal breath sounds bilaterally, no wheezing, rales,rhonchi or crepitation. No use of accessory muscles of respiration.  CARDIOVASCULAR: S1, S2 normal. No murmurs, rubs, or gallops.  ABDOMEN: Soft, non-tender, non-distended. Bowel sounds present. No organomegaly or mass.  EXTREMITIES: No pedal edema, cyanosis, or clubbing.  NEUROLOGIC: unable to exam. PSYCHIATRIC: The patient is nonverbal.  SKIN: No obvious rash, lesion, sacral DU stage 2.  DATA REVIEW:   CBC Recent Labs  Lab 07/18/17 1829  WBC 9.6  HGB 14.5  HCT 43.5  PLT 173    Chemistries  Recent Labs  Lab 07/18/17 1829 07/19/17 0006  07/20/17 0422  NA 145 146*   < > 146*  K 2.5* 2.6*   < > 3.5  CL 100* 97*   < > 105  CO2 30 35*   < > 34*  GLUCOSE 143* 138*   < > 90  BUN 11 12   < > 7  CREATININE 0.66 0.57*   < > 0.39*  CALCIUM 8.6* 8.7*   < > 8.3*  MG  --  1.7  --   --   AST 43*  --   --   --   ALT 20  --   --   --   ALKPHOS 72  --   --   --   BILITOT 0.6  --   --   --    < > = values in this interval not displayed.     Microbiology Results  Results for orders placed or performed during the hospital encounter of 07/18/17  MRSA PCR Screening     Status: None   Collection Time: 07/19/17 12:19 AM  Result Value Ref Range Status   MRSA by PCR NEGATIVE NEGATIVE Final      Comment:        The GeneXpert MRSA Assay (FDA approved for NASAL specimens only), is one component of a comprehensive MRSA colonization surveillance program. It is not intended to diagnose MRSA infection nor to guide or monitor treatment for MRSA infections.     RADIOLOGY:  US Carotid Bilateral  Result Date: 07/19/2017 CLINICAL DATA:  76 year old male with symptoms of syncope EXAM: BILATERAL CAROTID DUPLEX ULTRASOUND TECHNIQUE: Wallace Cullens scale imaging, color Doppler and duplex ultrasound were performed of bilateral carotid and vertebral arteries in the neck. COMPARISON:  Head CT 07/18/2017 FINDINGS: Criteria: Quantification of carotid stenosis is based on velocity parameters that correlate the residual internal carotid diameter with NASCET-based stenosis levels, using the diameter of the distal internal carotid lumen as  the denominator for stenosis measurement. The following velocity measurements were obtained: RIGHT ICA:  61/20 cm/sec CCA:  100/14 cm/sec SYSTOLIC ICA/CCA RATIO:  0.6 DIASTOLIC ICA/CCA RATIO:  1.5 ECA:  53 cm/sec LEFT ICA:  88/26 cm/sec CCA:  115/17 cm/sec SYSTOLIC ICA/CCA RATIO:  0.8 DIASTOLIC ICA/CCA RATIO:  1.6 ECA:  65 cm/sec RIGHT CAROTID ARTERY: Mild atherosclerotic plaque. By peak systolic velocity criteria, the estimated stenosis remains less than 50%. RIGHT VERTEBRAL ARTERY:  Patent with normal antegrade flow. LEFT CAROTID ARTERY: Trace smooth heterogeneous atherosclerotic plaque in the proximal internal carotid artery. By peak systolic velocity criteria, the estimated stenosis remains less than 50%. LEFT VERTEBRAL ARTERY:  Patent with normal antegrade flow. IMPRESSION: 1. Mild (1-49%) stenosis proximal right internal carotid artery secondary to trace heterogeneous atherosclerotic plaque. 2. Mild (1-49%) stenosis proximal left internal carotid artery secondary to trace heterogeneous atherosclerotic plaque. 3. Vertebral arteries are patent with normal antegrade flow. Signed,  Sterling BigHeath K. McCullough, MD [facility] Electronically Signed   By: Malachy MoanHeath  McCullough M.D.   On: 07/19/2017 12:16     Management plans discussed with the patient's wife and they are in agreement.  CODE STATUS: Full Code   TOTAL TIME TAKING CARE OF THIS PATIENT: 33 minutes.    Shaune PollackQing Abrahim Sargent M.D on 07/20/2017 at 10:07 AM  Between 7am to 6pm - Pager - [phone]  After 6pm go to [email or web address] - Scientist, research (life sciences)password [email or web address]  [employer]  Hospitalists  Office  [phone]  CC: Primary care physician; [place], [facility]   Note: This dictation was prepared with [employer] dictation along with smaller Lobbyistphrase technology. Any transcriptional errors that result from this process are unintentional.

## 2017-07-20 NOTE — Discharge Instructions (Signed)
Aspiration and fall precaution. Puree diet with thick nectar. Strict aspiration precaution.

## 2017-07-20 NOTE — Progress Notes (Signed)
[facility] later today. Patient VSS at baseline at this time.

## 2017-07-21 ENCOUNTER — Emergency Department: Payer: Medicare Other

## 2017-07-21 ENCOUNTER — Encounter
Admission: EM | Disposition: A | Discharge status: Skilled Nursing Facility | Payer: Self-pay | Source: Home / Self Care | Attending: Internal Medicine

## 2017-07-21 ENCOUNTER — Inpatient Hospital Stay
Admission: EM | Admit: 2017-07-21 | Discharge: 2017-07-24 | DRG: 388 | Disposition: A | Discharge status: Skilled Nursing Facility | Payer: Medicare Other | Attending: Internal Medicine | Admitting: Internal Medicine

## 2017-07-21 ENCOUNTER — Inpatient Hospital Stay: Payer: Medicare Other | Admitting: Anesthesiology

## 2017-07-21 DIAGNOSIS — Z8042 Family history of malignant neoplasm of prostate: Secondary | ICD-10-CM | POA: Diagnosis not present

## 2017-07-21 DIAGNOSIS — E162 Hypoglycemia, unspecified: Secondary | ICD-10-CM | POA: Diagnosis present

## 2017-07-21 DIAGNOSIS — K562 Volvulus: Secondary | ICD-10-CM | POA: Diagnosis present

## 2017-07-21 DIAGNOSIS — I10 Essential (primary) hypertension: Secondary | ICD-10-CM | POA: Diagnosis present

## 2017-07-21 DIAGNOSIS — Z823 Family history of stroke: Secondary | ICD-10-CM

## 2017-07-21 DIAGNOSIS — Z8673 Personal history of transient ischemic attack (TIA), and cerebral infarction without residual deficits: Secondary | ICD-10-CM

## 2017-07-21 DIAGNOSIS — D696 Thrombocytopenia, unspecified: Secondary | ICD-10-CM | POA: Diagnosis present

## 2017-07-21 DIAGNOSIS — I69320 Aphasia following cerebral infarction: Secondary | ICD-10-CM | POA: Diagnosis not present

## 2017-07-21 DIAGNOSIS — Z681 Body mass index (BMI) 19 or less, adult: Secondary | ICD-10-CM | POA: Diagnosis not present

## 2017-07-21 DIAGNOSIS — E876 Hypokalemia: Secondary | ICD-10-CM | POA: Diagnosis present

## 2017-07-21 DIAGNOSIS — Z7982 Long term (current) use of aspirin: Secondary | ICD-10-CM | POA: Diagnosis not present

## 2017-07-21 DIAGNOSIS — E43 Unspecified severe protein-calorie malnutrition: Secondary | ICD-10-CM | POA: Diagnosis present

## 2017-07-21 DIAGNOSIS — Z79899 Other long term (current) drug therapy: Secondary | ICD-10-CM | POA: Diagnosis not present

## 2017-07-21 HISTORY — PX: ESOPHAGOGASTRODUODENOSCOPY: SHX5428

## 2017-07-21 LAB — CBC WITH DIFFERENTIAL/PLATELET
Basophils Absolute: 0 10*3/uL (ref 0–0.1)
Basophils Relative: 1 %
EOS PCT: 2 %
Eosinophils Absolute: 0.2 10*3/uL (ref 0–0.7)
HCT: 37.7 % — ABNORMAL LOW (ref 40.0–52.0)
HEMOGLOBIN: 12.7 g/dL — AB (ref 13.0–18.0)
LYMPHS ABS: 1.3 10*3/uL (ref 1.0–3.6)
Lymphocytes Relative: 19 %
MCH: 30.9 pg (ref 26.0–34.0)
MCHC: 33.8 g/dL (ref 32.0–36.0)
MCV: 91.6 fL (ref 80.0–100.0)
MONO ABS: 0.4 10*3/uL (ref 0.2–1.0)
MONOS PCT: 6 %
Neutro Abs: 5.2 10*3/uL (ref 1.4–6.5)
Neutrophils Relative %: 72 %
PLATELETS: 145 10*3/uL — AB (ref 150–440)
RBC: 4.11 MIL/uL — ABNORMAL LOW (ref 4.40–5.90)
RDW: 14 % (ref 11.5–14.5)
WBC: 7.1 10*3/uL (ref 3.8–10.6)

## 2017-07-21 LAB — COMPREHENSIVE METABOLIC PANEL
ALK PHOS: 82 U/L (ref 38–126)
ALT: 19 U/L (ref 17–63)
ANION GAP: 9 (ref 5–15)
AST: 29 U/L (ref 15–41)
Albumin: 3.8 g/dL (ref 3.5–5.0)
BUN: 8 mg/dL (ref 6–20)
CALCIUM: 8.8 mg/dL — AB (ref 8.9–10.3)
CO2: 32 mmol/L (ref 22–32)
Chloride: 102 mmol/L (ref 101–111)
Creatinine, Ser: 0.46 mg/dL — ABNORMAL LOW (ref 0.61–1.24)
GFR calc non Af Amer: >60 mL/min (ref >60)
Glucose, Bld: 119 mg/dL — ABNORMAL HIGH (ref 65–99)
POTASSIUM: 3.2 mmol/L — AB (ref 3.5–5.1)
SODIUM: 143 mmol/L (ref 135–145)
Total Bilirubin: 0.7 mg/dL (ref 0.3–1.2)
Total Protein: 7.2 g/dL (ref 6.5–8.1)

## 2017-07-21 LAB — TROPONIN I

## 2017-07-21 SURGERY — SIGMOIDOSCOPY, FLEXIBLE
Anesthesia: General | Wound class: Dirty or Infected

## 2017-07-21 SURGERY — EGD (ESOPHAGOGASTRODUODENOSCOPY)
Anesthesia: General

## 2017-07-21 MED ORDER — LACTATED RINGERS IV SOLN
INTRAVENOUS | Status: DC | PRN
  Administered 2017-07-21: 23:00:00 via INTRAVENOUS

## 2017-07-21 MED ORDER — PHENYLEPHRINE HCL 10 MG/ML IJ SOLN
INTRAMUSCULAR | Status: DC | PRN
  Administered 2017-07-21: 50 ug via INTRAVENOUS
  Administered 2017-07-21: 100 ug via INTRAVENOUS

## 2017-07-21 MED ORDER — PROPOFOL 10 MG/ML IV BOLUS
INTRAVENOUS | Status: AC
  Filled 2017-07-21: qty 20

## 2017-07-21 MED ORDER — PROPOFOL 500 MG/50ML IV EMUL
INTRAVENOUS | Status: DC | PRN
  Administered 2017-07-21: 25 ug/kg/min via INTRAVENOUS

## 2017-07-21 MED ORDER — PROPOFOL 10 MG/ML IV BOLUS
INTRAVENOUS | Status: DC | PRN
  Administered 2017-07-21: 20 mg via INTRAVENOUS

## 2017-07-21 NOTE — Anesthesia Procedure Notes (Signed)
Date/Time: 07/21/2017 11:24 PM Performed by: Junious SilkNoles, Lyndi Holbein, CRNA Pre-anesthesia Checklist: Patient identified, Emergency Drugs available, Suction available, Patient being monitored and Timeout performed Oxygen Delivery Method: Nasal cannula

## 2017-07-21 NOTE — ED Triage Notes (Signed)
Pt presents to ED via EMS from [facility] with c/o abd distension. Pt was seen in this ED on Saturday for asystole at his facility and pt went into cardiac arrest while being treated in ED. Non-verbal at baseline. Alert and calm at this time. Denies abd pain. abd firm, non-tender, to touch.

## 2017-07-21 NOTE — Transfer of Care (Signed)
Immediate Anesthesia Transfer of Care Note  Patient: Bill LentoWilliam M [name]  Procedure(s) Performed: FLEXIBLE SIGMOIDOSCOPY with reduction of sigmoid volvulus (N/A )  Patient Location: PACU  Anesthesia Type:General  Level of Consciousness: awake  Airway & Oxygen Therapy: Patient Spontanous Breathing and Patient connected to nasal cannula oxygen  Post-op Assessment: Report given to RN and Post -op Vital signs reviewed and stable  Post vital signs: Reviewed and stable  Last Vitals:  Vitals:   07/21/17 1958 07/21/17 2220  BP: (!) 143/93 (!) 151/82  Pulse: (!) 51 67  Resp:  16  Temp: 36.6 C   SpO2: 91% 100%    Last Pain:  Vitals:   07/21/17 1958  TempSrc: Oral         Complications: No apparent anesthesia complications

## 2017-07-21 NOTE — ED Provider Notes (Addendum)
[facility] Emergency Department Provider Note    First MD Initiated Contact with Patient 07/21/17 2001     (approximate)  I have reviewed the triage vital signs and the nursing notes.   HISTORY  Chief Complaint Bloated    HPI Bill [name] is a 76 y.o. male with recent admission to the hospital after asystolic cardiac arrest presents to the ER with concern for abdominal distention.  Patient denies any complaints but does feel that his abdomen is distended.  He has a history of sigmoid volvulus and has had obstructions in the past.  He is presenting from nursing facility due to history of stroke.  Denies any chest pain or shortness of breath at this time.  States that he did move his bowels this morning still passing gas.  Past Medical History:  Diagnosis Date  . Aphagia   . HTN (hypertension)   . Sigmoid volvulus (HCC)   . Stroke [facility]) 2002   completely dependent with ADLs/IADLs   Family History  Problem Relation Age of Onset  . Stroke Father   . Prostate cancer Father    Past Surgical History:  Procedure Laterality Date  . FLEXIBLE SIGMOIDOSCOPY Left 04/14/2016   Procedure: FLEXIBLE SIGMOIDOSCOPY;  Surgeon: Kathi Der, MD;  Location: [facility] ENDOSCOPY;  Service: Gastroenterology;  Laterality: Left;   Patient Active Problem List   Diagnosis Date Noted  . Pressure injury of skin 07/19/2017  . Syncope 07/18/2017  . Cardiac arrest (HCC) 02/01/2017  . HTN (hypertension) 02/01/2017  . 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 H/O: CVA (cerebrovascular accident) 04/13/2016  . Pulmonary nodule 04/13/2016  . Hypokalemia 04/13/2016      Prior to Admission medications   Medication Sig Start Date End Date Taking? Authorizing Provider  acetaminophen (TYLENOL) 325 MG tablet Take 650 mg by mouth every 6 (six) hours as needed.   Yes [provider]  aspirin 81 MG chewable tablet Chew 81 mg by mouth daily. Crush tablet   Yes [provider]  atorvastatin (LIPITOR) 20 MG tablet Take 1 tablet (20 mg total) by mouth daily at 6 PM. 07/20/17  Yes Shaune Pollack, MD  ondansetron (ZOFRAN) 4 MG tablet Take 1 tablet (4 mg total) by mouth every 6 (six) hours as needed for nausea. 04/23/16  Yes Richarda Overlie, MD  sennosides-docusate sodium (SENOKOT-S) 8.6-50 MG tablet Take 1 tablet by mouth every other day.   Yes [provider]  scopolamine (TRANSDERM-SCOP) 1 MG/3DAYS Place 1 patch onto the skin every 3 (three) days.    [provider]    Allergies Patient has no known allergies.    Social History Social History   Tobacco Use  . Smoking status: Never Smoker  . Smokeless tobacco: Never Used  Substance Use Topics  . Alcohol use: No  . Drug use: No    Review of Systems Patient denies headaches, rhinorrhea, blurry vision, numbness, shortness of breath, chest pain, edema, cough, abdominal pain, nausea, vomiting, diarrhea, dysuria, fevers, rashes or hallucinations unless otherwise stated above in HPI. ____________________________________________   PHYSICAL EXAM:  VITAL SIGNS: Vitals:   07/21/17 2220 07/21/17 2345  BP: (!) 151/82 (!) 112/59  Pulse: 67 74  Resp: 16 16  Temp:  (!) 97.4 F (36.3 C)  SpO2: 100% 100%    Constitutional: Alert and oriented. Well appearing and in no acute distress. Eyes: Conjunctivae are normal.  Head: Atraumatic. Nose: No congestion/rhinnorhea. Mouth/Throat: Mucous membranes are moist.   Neck: No stridor. Painless  ROM.  Cardiovascular: Normal rate, regular rhythm. Grossly normal heart sounds.  Good peripheral circulation. Respiratory: Normal respiratory effort.  No retractions. Lungs CTAB. Gastrointestinal: Soft and nontender. No distention. No abdominal bruits. No CVA tenderness. Genitourinary: normal external genitalia Musculoskeletal: No lower extremity tenderness nor edema.  No joint effusions. Neurologic: slurred speech, aphasic, no new neuro deficitis appreicated Skin:   Skin is warm, dry and intact. No rash noted. Psychiatric: Mood and affect are normal. Speech and behavior are normal.  ____________________________________________   LABS (all labs ordered are listed, but only abnormal results are displayed)  Results for orders placed or performed during the hospital encounter of 07/21/17 (from the past 24 hour(s))  CBC with Differential/Platelet     Status: Abnormal   Collection Time: 07/21/17  8:05 PM  Result Value Ref Range   WBC 7.1 3.8 - 10.6 K/uL   RBC 4.11 (L) 4.40 - 5.90 MIL/uL   Hemoglobin 12.7 (L) 13.0 - 18.0 g/dL   HCT 96.037.7 (L) 45.440.0 - 09.852.0 %   MCV 91.6 80.0 - 100.0 fL   MCH 30.9 26.0 - 34.0 pg   MCHC 33.8 32.0 - 36.0 g/dL   RDW 11.914.0 14.711.5 - 82.914.5 %   Platelets 145 (L) 150 - 440 K/uL   Neutrophils Relative % 72 %   Neutro Abs 5.2 1.4 - 6.5 K/uL   Lymphocytes Relative 19 %   Lymphs Abs 1.3 1.0 - 3.6 K/uL   Monocytes Relative 6 %   Monocytes Absolute 0.4 0.2 - 1.0 K/uL   Eosinophils Relative 2 %   Eosinophils Absolute 0.2 0 - 0.7 K/uL   Basophils Relative 1 %   Basophils Absolute 0.0 0 - 0.1 K/uL  Comprehensive metabolic panel     Status: Abnormal   Collection Time: 07/21/17  8:05 PM  Result Value Ref Range   Sodium 143 135 - 145 mmol/L   Potassium 3.2 (L) 3.5 - 5.1 mmol/L   Chloride 102 101 - 111 mmol/L   CO2 32 22 - 32 mmol/L   Glucose, Bld 119 (H) 65 - 99 mg/dL   BUN 8 6 - 20 mg/dL   Creatinine, Ser 5.620.46 (L) 0.61 - 1.24 mg/dL   Calcium 8.8 (L) 8.9 - 10.3 mg/dL   Total Protein 7.2 6.5 - 8.1 g/dL   Albumin 3.8 3.5 - 5.0 g/dL   AST 29 15 - 41 U/L   ALT 19 17 - 63 U/L   Alkaline Phosphatase 82 38 - 126 U/L   Total Bilirubin 0.7 0.3 - 1.2 mg/dL   GFR calc non Af Amer >60 >60 mL/min   GFR calc Af Amer >60 >60 mL/min   Anion gap 9 5 - 15  Troponin I     Status: None   Collection Time: 07/21/17  8:05 PM  Result Value Ref Range   Troponin I <0.03 <0.03 ng/mL   ____________________________________________  EKG My review and  personal interpretation at Time:  20:05  Indication: abd distention  Rate: 80  Rhythm: sinus Axis: normal Other: incomplete study limiting interpretation, normal intervals ____________________________________________  RADIOLOGY  I personally reviewed all radiographic images ordered to evaluate for the above acute complaints and reviewed radiology reports and findings.  These findings were personally discussed with the patient.  Please see medical record for radiology report.  ____________________________________________   PROCEDURES  Procedure(s) performed:  .Critical Care Performed by: Willy Eddyobinson, Janayla Marik, MD Authorized by: Willy Eddyobinson, Mckynzi Cammon, MD   Critical care provider statement:    Critical care time (  minutes):  30   Critical care time was exclusive of:  Separately billable procedures and treating other patients   Critical care was time spent personally by me on the following activities:  Development of treatment plan with patient or surrogate, discussions with consultants, evaluation of patient's response to treatment, examination of patient, obtaining history from patient or surrogate, ordering and performing treatments and interventions, ordering and review of laboratory studies, ordering and review of radiographic studies, pulse oximetry, re-evaluation of patient's condition and review of old charts      Critical Care performed: no ____________________________________________   INITIAL IMPRESSION / ASSESSMENT AND PLAN / ED COURSE  Pertinent labs & imaging results that were available during my care of the patient were reviewed by me and considered in my medical decision making (see chart for details).  DDX: volvulus, sbo, enteritis, ascites, aki, dehydration  Iva LentoWilliam M Brodhead is a 76 y.o. who presents to the ED with symptoms as described above.  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  Blood work will be sent for the  above differential.  Patient also very high risk as he just was discharged from the hospital for asystolic cardiac arrest.  Patient denies any pain at this time.  Chest x-ray is reassuring shows no free air.  CT imaging will be ordered to evaluate for obstructive pattern.  Clinical Course as of Jul 21 2349  Tue Jul 21, 2017  2144 CT with evidence of recurrent volvulus.  Will page gastroenterology.  [PR]  2159 With family at bedside regarding the patient's results.  Said that they would want to proceed with sigmoidoscopy understanding the risks of the procedure.  Patient will be admitted to the hospitalist for further medical management.  Gastroenterology is aware and will consult on patient for intervention.  [PR]    Clinical Course User Index [PR] Willy Eddyobinson, Kamerin Axford, MD     ____________________________________________   FINAL CLINICAL IMPRESSION(S) / ED DIAGNOSES  Final diagnoses:  Sigmoid volvulus (HCC)  Volvulus of sigmoid colon (HCC)      NEW MEDICATIONS STARTED DURING THIS VISIT:  This SmartLink is deprecated. Use AVSMEDLIST instead to display the medication list for a patient.   Note:  This document was prepared using Dragon voice recognition software and may include unintentional dictation errors.    Willy Eddyobinson, Deandrea Rion, MD 07/21/17 2349    Willy Eddyobinson, Iraida Cragin, MD 07/21/17 2350

## 2017-07-21 NOTE — ED Notes (Signed)
Admitting MD at bedside to see pt and NT in room to transport pt after MD assessment. Pt is stable and in NAD at this time.

## 2017-07-21 NOTE — Anesthesia Preprocedure Evaluation (Signed)
Anesthesia Evaluation  Patient identified by MRN, date of birth, ID band Patient awake    Reviewed: Allergy & Precautions, H&P , NPO status , Patient's Chart, lab work & pertinent test results, reviewed documented beta blocker date and time   Airway Mallampati: II   Neck ROM: full    Dental  (+) Poor Dentition   Pulmonary neg pulmonary ROS,    Pulmonary exam normal        Cardiovascular Exercise Tolerance: Poor hypertension, On Medications negative cardio ROS Normal cardiovascular exam Rhythm:regular Rate:Normal     Neuro/Psych CVA negative neurological ROS  negative psych ROS   GI/Hepatic negative GI ROS, Neg liver ROS,   Endo/Other  negative endocrine ROS  Renal/GU negative Renal ROS  negative genitourinary   Musculoskeletal   Abdominal   Peds  Hematology negative hematology ROS (+)   Anesthesia Other Findings Past Medical History: No date: Aphagia No date: HTN (hypertension) No date: Sigmoid volvulus (HCC) 2002: Stroke [facility](HCC)     Comment:  completely dependent with ADLs/IADLs Past Surgical History: 04/14/2016: FLEXIBLE SIGMOIDOSCOPY; Left     Comment:  Procedure: FLEXIBLE SIGMOIDOSCOPY;  Surgeon: Kathi DerParag               Brahmbhatt, MD;  Location: [facility] ENDOS[facility];  Service:               Gastroenterology;  Laterality: Left; BMI    Body Mass Index:  14.09 kg/m     Reproductive/Obstetrics negative OB ROS                             Anesthesia Physical Anesthesia Plan  ASA: IV and emergent  Anesthesia Plan: General   Post-op Pain Management:    Induction:   PONV Risk Score and Plan: 4 or greater  Airway Management Planned:   Additional Equipment:   Intra-op Plan:   Post-operative Plan:   Informed Consent: I have reviewed the patients History and Physical, chart, labs and discussed the procedure including the risks, benefits and alternatives for the proposed anesthesia with  the patient or authorized representative who has indicated his/her understanding and acceptance.   Dental Advisory Given  Plan Discussed with: CRNA  Anesthesia Plan Comments:         Anesthesia Quick Evaluation

## 2017-07-21 NOTE — Anesthesia Post-op Follow-up Note (Signed)
Anesthesia QCDR form completed.        

## 2017-07-21 NOTE — Consult Note (Addendum)
Re:   Bill LentoWilliam M Ditommaso DOB:   06/27/1941 MRN:   [id number]  ASSESSMENT AND PLAN:  1. Sigmoid volvulus with +/- obstruction.   No diagnosis found. Plan sigmoidoscopy with volvulus reduction. Discussed sigmoidoscopy with patient's son and Power of attorney, Sylvester Harderodd Coviello. Sylvester Harderodd Bihm understands the nature of the planned procedure, indications, risks, alternatives and potential complications including but not limited to bleeding, infection, perforation, damage to internal organs and possible oversedation/side effects from anesthesia. The power of attorney, Mr. Sylvester Harderodd Rohl,  agrees and gives consent to proceed.  Please refer to procedure notes for findings, recommendations and patient disposition/instructions.  Chief Complaint  Patient presents with  . Bloated   REFERRING PHYSICIAN: [place]  HISTORY OF PRESENT ILLNESS: Bill LentoWilliam M Petrosian is a 76 y.o. (DOB: 06/08/1941)  76 y/o male whose primary care physician is [place], [facility] and comes to me today for sigmoid volvulus. He has distension, no obvious pain, stoic lying in the bed. Had a sigmoid volvulus last month, 06/15/2017, by DR. Tahiliani, who reduced the volvulus without incident.       No current facility-administered medications for this encounter.    Current Outpatient Medications  Medication Sig Dispense Refill  . acetaminophen (TYLENOL) 325 MG tablet Take 650 mg by mouth every 6 (six) hours as needed.    Marland Kitchen. aspirin 81 MG chewable tablet Chew 81 mg by mouth daily. Crush tablet    . atorvastatin (LIPITOR) 20 MG tablet Take 1 tablet (20 mg total) by mouth daily at 6 PM.    . ondansetron (ZOFRAN) 4 MG tablet Take 1 tablet (4 mg total) by mouth every 6 (six) hours as needed for nausea. 20 tablet 0  . sennosides-docusate sodium (SENOKOT-S) 8.6-50 MG tablet Take 1 tablet by mouth every other day.    . scopolamine (TRANSDERM-SCOP) 1 MG/3DAYS Place 1 patch onto the skin every 3 (three) days.       No Known  Allergies    Past Medical History:  Diagnosis Date  . Aphagia   . HTN (hypertension)   . Sigmoid volvulus (HCC)   . Stroke [facility](HCC) 2002   completely dependent with ADLs/IADLs      Past Surgical History:  Procedure Laterality Date  . FLEXIBLE SIGMOIDOSCOPY Left 04/14/2016   Procedure: FLEXIBLE SIGMOIDOSCOPY;  Surgeon: Kathi DerParag Brahmbhatt, MD;  Location: [facility] ENDOSCOPY;  Service: Gastroenterology;  Laterality: Left;     The patient has a family history of   shoulder   REVIEW OF SYSTEMS: Unobtainable   PHYSICAL EXAM: Physical Exam  Constitutional:  Stoic and nonverbal  HENT:  Head: Normocephalic and atraumatic.  Eyes: Pupils are equal, round, and reactive to light.  Neck: Normal range of motion.  Cardiovascular: Normal rate.  Pulmonary/Chest: Effort normal.  Abdominal: Soft. He exhibits distension. He exhibits no mass. There is no tenderness. There is no rebound and no guarding.  Musculoskeletal: Normal range of motion.  Vitals reviewed.  @cegpenew @  DATA REVIEWED: labs

## 2017-07-21 NOTE — Op Note (Signed)
[facility] Gastroenterology Patient Name: Bill [name] Procedure Date: 07/21/2017 11:13 PM MRN: [id number] Account #: [id number] Date of Birth: [birth year] Admit Type: Outpatient Age: 76 Room: [facility] ENDO ROOM 4 Gender: Male Note Status: Finalized Procedure:            Colonoscopy Indications:          Evaluation on imaging study of clinically significant                        abnormality, Decompression of sigmoid volvulus Providers:            Royce Macadamia K. Toledo MD, MD Medicines:            Propofol per Anesthesia Complications:        No immediate complications. Procedure:            Pre-Anesthesia Assessment:                       - The risks and benefits of the procedure and the                        sedation options and risks were discussed with the                        patient. All questions were answered and informed                        consent was obtained.                       - Patient identification and proposed procedure were                        verified prior to the procedure by the nurse. The                        procedure was verified in the procedure room.                       - ASA Grade Assessment: IV - A patient with severe                        systemic disease that is a constant threat to life.                       - After reviewing the risks and benefits, the patient                        was deemed in satisfactory condition to undergo the                        procedure.                       After obtaining informed consent, the colonoscope was                        passed under direct vision. Throughout the procedure,                        the patient's blood pressure, pulse,  and oxygen                        saturations were monitored continuously. The                        Colonoscope was introduced through the anus and                        advanced to the the descending colon. The quality of   the bowel preparation was poor. Findings:      The perianal and digital rectal examinations were normal.      A volvulus, with viable appearing mucosa, was found in the sigmoid       colon. Decompression of the volvulus was attempted and was successful,       with complete decompression achieved. Tube placement unsuccessful d/t       equipment issue. No biopsies or other specimens were collected for this       exam. Impression:           - Preparation of the colon was poor.                       - Volvulus. Successful complete decompression achieved.                        No specimens collected. Recommendation:       - Return patient to hospital ward for observation.                       - Perform a flat plate abdominal x-ray today.                       - NPO today, then advance as tolerated to resume                        previous diet for 10 hours.                       - Continue present medications.                       - The findings and recommendations were discussed with                        the patient's family. Procedure Code(s):    --- Professional ---                       [phone], 52, Colonoscopy, flexible; with decompression                        (for pathologic distention) (eg, volvulus, megacolon),                        including placement of decompression tube, when                        performed Diagnosis Code(s):    --- Professional ---                       K56.2, Volvulus  R93.3, Abnormal findings on diagnostic imaging of other                        parts of digestive tract CPT copyright 2016 American Medical Association. All rights reserved. The codes documented in this report are preliminary and upon coder review may  be revised to meet current compliance requirements. Stanton Kidneyeodoro K Toledo MD, MD 07/21/2017 11:39:11 PM This report has been signed electronically. Number of Addenda: 0 Note Initiated On: 07/21/2017 11:13 PM Total  Procedure Duration: 0 hours 9 minutes 57 seconds       [facility]

## 2017-07-21 NOTE — H&P (Signed)
[facility] - [place] at [facility]   PATIENT NAME: Bill [name]    MR#:  [id number]  DATE OF BIRTH:  1941-07-08  DATE OF ADMISSION:  07/21/2017  PRIMARY CARE PHYSICIAN: [place], [facility]   REQUESTING/REFERRING PHYSICIAN: Roxan Hockey, MD  CHIEF COMPLAINT:   Chief Complaint  Patient presents with  . Bloated    HISTORY OF PRESENT ILLNESS:  Bill [name]  is a 76 y.o. male who presents with significant abdominal pain.  Imaging here in the ED shows volvulus.  Patient has had this in the past, and surgery does not feel like he is a candidate.  GI typically will come in and correct his volvulus through colonoscopy.  Gastroenterologist was contacted by ED physician and plans to come in for colonoscopy tonight.  Hospitalist were called for admission  PAST MEDICAL HISTORY:   Past Medical History:  Diagnosis Date  . Aphagia   . HTN (hypertension)   . Sigmoid volvulus (HCC)   . Stroke [facility] 2002   completely dependent with ADLs/IADLs    PAST SURGICAL HISTORY:   Past Surgical History:  Procedure Laterality Date  . FLEXIBLE SIGMOIDOSCOPY Left 04/14/2016   Procedure: FLEXIBLE SIGMOIDOSCOPY;  Surgeon: Kathi Der, MD;  Location: [facility] ENDOSCOPY;  Service: Gastroenterology;  Laterality: Left;    SOCIAL HISTORY:   Social History   Tobacco Use  . Smoking status: Never Smoker  . Smokeless tobacco: Never Used  Substance Use Topics  . Alcohol use: No    FAMILY HISTORY:   Family History  Problem Relation Age of Onset  . Stroke Father   . Prostate cancer Father     DRUG ALLERGIES:  No Known Allergies  MEDICATIONS AT HOME:   Prior to Admission medications   Medication Sig Start Date End Date Taking? Authorizing Provider  acetaminophen (TYLENOL) 325 MG tablet Take 650 mg by mouth every 6 (six) hours as needed.   Yes [provider]  aspirin 81 MG chewable tablet Chew 81 mg by mouth daily. Crush tablet   Yes [provider]   atorvastatin (LIPITOR) 20 MG tablet Take 1 tablet (20 mg total) by mouth daily at 6 PM. 07/20/17  Yes Shaune Pollack, MD  ondansetron (ZOFRAN) 4 MG tablet Take 1 tablet (4 mg total) by mouth every 6 (six) hours as needed for nausea. 04/23/16  Yes Richarda Overlie, MD  sennosides-docusate sodium (SENOKOT-S) 8.6-50 MG tablet Take 1 tablet by mouth every other day.   Yes [provider]  scopolamine (TRANSDERM-SCOP) 1 MG/3DAYS Place 1 patch onto the skin every 3 (three) days.    [provider]    REVIEW OF SYSTEMS:  Review of Systems  Constitutional: Negative for chills, fever, malaise/fatigue and weight loss.  HENT: Negative for ear pain, hearing loss and tinnitus.   Eyes: Negative for blurred vision, double vision, pain and redness.  Respiratory: Negative for cough, hemoptysis and shortness of breath.   Cardiovascular: Negative for chest pain, palpitations, orthopnea and leg swelling.  Gastrointestinal: Positive for abdominal pain. Negative for constipation, diarrhea, nausea and vomiting.  Genitourinary: Negative for dysuria, frequency and hematuria.  Musculoskeletal: Negative for back pain, joint pain and neck pain.  Skin:       No acne, rash, or lesions  Neurological: Negative for dizziness, tremors, focal weakness and weakness.  Endo/Heme/Allergies: Negative for polydipsia. Does not bruise/bleed easily.  Psychiatric/Behavioral: Negative for depression. The patient is not nervous/anxious and does not have insomnia.      VITAL SIGNS:  Vitals:   07/21/17 1958  BP: (!) 143/93  Pulse: (!) 51  Temp: 97.8 F (36.6 C)  TempSrc: Oral  SpO2: 91%  Weight: 45.8 kg (101 lb)  Height: 5\' 11"  (1.803 m)   Wt Readings from Last 3 Encounters:  07/21/17 45.8 kg (101 lb)  07/19/17 47.1 kg (103 lb 12.8 oz)  06/16/17 48.1 kg (106 lb)    PHYSICAL EXAMINATION:  Physical Exam  Vitals reviewed. Constitutional: He is oriented to person, place, and time. He appears well-developed and  well-nourished. No distress.  HENT:  Head: Normocephalic and atraumatic.  Mouth/Throat: Oropharynx is clear and moist.  Eyes: Conjunctivae and EOM are normal. Pupils are equal, round, and reactive to light. No scleral icterus.  Neck: Normal range of motion. Neck supple. No JVD present. No thyromegaly present.  Cardiovascular: Normal rate, regular rhythm and intact distal pulses. Exam reveals no gallop and no friction rub.  No murmur heard. Respiratory: Effort normal and breath sounds normal. No respiratory distress. He has no wheezes. He has no rales.  GI: Soft. Bowel sounds are normal. He exhibits no distension. There is tenderness.  Musculoskeletal: Normal range of motion. He exhibits no edema.  No arthritis, no gout  Lymphadenopathy:    He has no cervical adenopathy.  Neurological: He is alert and oriented to person, place, and time. No cranial nerve deficit.  No dysarthria, no aphasia  Skin: Skin is warm and dry. No rash noted. No erythema.  Psychiatric: He has a normal mood and affect. His behavior is normal. Judgment and thought content normal.    LABORATORY PANEL:   CBC Recent Labs  Lab 07/21/17 2005  WBC 7.1  HGB 12.7*  HCT 37.7*  PLT 145*   ------------------------------------------------------------------------------------------------------------------  Chemistries  Recent Labs  Lab 07/19/17 0006  07/21/17 2005  NA 146*   < > 143  K 2.6*   < > 3.2*  CL 97*   < > 102  CO2 35*   < > 32  GLUCOSE 138*   < > 119*  BUN 12   < > 8  CREATININE 0.57*   < > 0.46*  CALCIUM 8.7*   < > 8.8*  MG 1.7  --   --   AST  --   --  29  ALT  --   --  19  ALKPHOS  --   --  82  BILITOT  --   --  0.7   < > = values in this interval not displayed.   ------------------------------------------------------------------------------------------------------------------  Cardiac Enzymes Recent Labs  Lab 07/21/17 2005  TROPONINI <0.03    ------------------------------------------------------------------------------------------------------------------  RADIOLOGY:  Ct Abdomen Pelvis Wo Contrast  Result Date: 07/21/2017 CLINICAL DATA:  Acute onset of generalized abdominal distention. Status post cardiac arrest. EXAM: CT ABDOMEN AND PELVIS WITHOUT CONTRAST TECHNIQUE: Multidetector CT imaging of the abdomen and pelvis was performed following the standard protocol without IV contrast. COMPARISON:  CT of the abdomen and pelvis from 06/15/2017 FINDINGS: Lower chest: Minimal scarring is noted at the lung bases. Scattered coronary artery calcifications are seen. A 5 mm nodule is noted at the right middle lobe (image 1 of 21), grossly stable from 2017 and likely benign. A calcified right-sided pleural plaque is also noted. Hepatobiliary: The liver is grossly unremarkable in appearance. Stones are seen filling the gallbladder. The common bile duct is not well characterized but appears grossly unremarkable. Pancreas: The pancreas is grossly unremarkable in appearance. Spleen: The spleen is unremarkable in appearance. Adrenals/Urinary Tract: The  adrenal glands are grossly unremarkable. Nonspecific perinephric stranding is noted bilaterally, more prominent on the left. A few small nonobstructing right renal stones are noted. There is no evidence of hydronephrosis. No obstructing ureteral stones are identified. Stomach/Bowel: The stomach is unremarkable in appearance. The small bowel is within normal limits. The appendix is normal in caliber, without evidence of appendicitis. There is diffuse distention of the colon with air and fluid, as on the prior study. 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 Diffuse distention of the colon extends along its entire course, unlike the typical sigmoid volvulus, with air-filled loops of distal  small bowel also seen. Vascular/Lymphatic: Scattered calcification is seen along the abdominal aorta and its branches. The abdominal aorta is otherwise grossly unremarkable. The inferior vena cava is grossly unremarkable. No retroperitoneal lymphadenopathy is seen. No pelvic sidewall lymphadenopathy is identified. Reproductive: The bladder is moderately distended and grossly unremarkable. The prostate is enlarged, measuring 5.3 cm in transverse dimension. Other: No additional soft tissue abnormalities are seen. Musculoskeletal: No acute osseous abnormalities are identified. The visualized musculature is unremarkable in appearance. IMPRESSION: 1. 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 Unlike the typical sigmoid volvulus, diffuse distention of the colon extends along its entire course, with air-filled loops of distal small bowel also seen. 2. Scattered coronary artery calcifications noted. 3. Calcified right-sided pleural plaque noted, raising question for prior asbestos exposure. 4. Cholelithiasis.  Gallbladder otherwise grossly unremarkable. 5. Small nonobstructing right renal stones noted. 6. Enlarged prostate seen. Aortic Atherosclerosis (ICD10-I70.0). Electronically Signed   By: Roanna RaiderJeffery  Chang M.D.   On: 07/21/2017 21:34   Dg Chest Portable 1 View  Result Date: 07/21/2017 CLINICAL DATA:  Abdominal distention. EXAM: PORTABLE CHEST 1 VIEW COMPARISON:  Cyst PA and lateral chest 07/18/2017. Single-view of the chest 02/01/2017. Two views of the abdomen 06/16/2017. FINDINGS: Lungs are clear. Heart size is normal. No pneumothorax or pleural effusion. Gaseous distention of colon in the upper abdomen does not appear markedly changed. IMPRESSION: No acute disease. Chronic gaseous distention of the colon. Electronically Signed   By: Drusilla Kannerhomas  Dalessio M.D.   On: 07/21/2017 21:02     EKG:   Orders placed or performed during the hospital encounter of 07/21/17  . ED EKG  . ED EKG  . EKG 12-Lead  . EKG 12-Lead    IMPRESSION AND PLAN:  Principal Problem:   Sigmoid volvulus (HCC) -endoscopic correction of his volvulus plan by gastroenterology, follow their recommendations Active Problems:   HTN (hypertension) -continue home meds   H/O: CVA (cerebrovascular accident) -continue home medications  All the records are reviewed and case discussed with ED provider. Management plans discussed with the patient and/or family.  DVT PROPHYLAXIS: SubQ lovenox  GI PROPHYLAXIS: None  ADMISSION STATUS: Inpatient  CODE STATUS: Full Code Status History    Date Active Date Inactive Code Status Order ID Comments User Context   07/18/2017 23:59 07/20/2017 18:36 Full Code [id number]  Bertrum SolSalary, Montell D, MD Inpatient   06/15/2017 21:30 06/17/2017 19:26 Full Code [id number]  Shaune Pollackhen, Qing, MD Inpatient   02/01/2017 19:45 02/02/2017 20:00 Full Code [id number]  Oralia ManisWillis, Masha Orbach, MD Inpatient   04/14/2016 01:48 04/23/2016 16:58 Full Code [id number]  Jonah BlueYates, Jennifer, MD Inpatient      TOTAL TIME TAKING CARE OF THIS PATIENT: 45  minutes.   Quinnlyn Hearns FIELDING 07/21/2017, 10:19 PM  [facility]  Office  [phone]  CC: Primary care physician; [place], [facility]  Note:  This document was prepared using Conservation officer, historic buildingsDragon voice recognition software and may include unintentional dictation errors.

## 2017-07-21 NOTE — ED Notes (Addendum)
Tawanna Coolerodd, son, [phone].  Call if any changes or information. Chrissie NoaWilliam, son, [phone].  Call if any information or changes.

## 2017-07-22 ENCOUNTER — Encounter: Payer: Self-pay | Admitting: Internal Medicine

## 2017-07-22 ENCOUNTER — Inpatient Hospital Stay: Payer: Medicare Other

## 2017-07-22 LAB — BASIC METABOLIC PANEL
Anion gap: 9 (ref 5–15)
BUN: 7 mg/dL (ref 6–20)
CALCIUM: 8.6 mg/dL — AB (ref 8.9–10.3)
CHLORIDE: 103 mmol/L (ref 101–111)
CO2: 32 mmol/L (ref 22–32)
Creatinine, Ser: 0.38 mg/dL — ABNORMAL LOW (ref 0.61–1.24)
GFR calc Af Amer: >60 mL/min (ref >60)
GLUCOSE: 71 mg/dL (ref 65–99)
Potassium: 3.1 mmol/L — ABNORMAL LOW (ref 3.5–5.1)
Sodium: 144 mmol/L (ref 135–145)

## 2017-07-22 LAB — CBC
HEMATOCRIT: 37.6 % — AB (ref 40.0–52.0)
HEMOGLOBIN: 12.7 g/dL — AB (ref 13.0–18.0)
MCH: 31.5 pg (ref 26.0–34.0)
MCHC: 33.8 g/dL (ref 32.0–36.0)
MCV: 93.2 fL (ref 80.0–100.0)
Platelets: 119 10*3/uL — ABNORMAL LOW (ref 150–440)
RBC: 4.03 MIL/uL — ABNORMAL LOW (ref 4.40–5.90)
RDW: 14 % (ref 11.5–14.5)
WBC: 8.1 10*3/uL (ref 3.8–10.6)

## 2017-07-22 MED ORDER — SODIUM CHLORIDE 0.9 % IV SOLN
INTRAVENOUS | Status: DC
  Administered 2017-07-22: 01:00:00 via INTRAVENOUS

## 2017-07-22 MED ORDER — SENNOSIDES-DOCUSATE SODIUM 8.6-50 MG PO TABS
1.0000 | ORAL_TABLET | Freq: Two times a day (BID) | ORAL | Status: DC
  Administered 2017-07-22 – 2017-07-23 (×3): 1 via ORAL
  Filled 2017-07-22 (×3): qty 1

## 2017-07-22 MED ORDER — ASPIRIN 81 MG PO CHEW
81.0000 mg | CHEWABLE_TABLET | Freq: Every day | ORAL | Status: DC
  Administered 2017-07-22 – 2017-07-23 (×2): 81 mg via ORAL
  Filled 2017-07-22 (×2): qty 1

## 2017-07-22 MED ORDER — OXYCODONE HCL 5 MG PO TABS: 5.0000 mg | ORAL_TABLET | ORAL | Status: DC | PRN

## 2017-07-22 MED ORDER — ATORVASTATIN CALCIUM 20 MG PO TABS
20.0000 mg | ORAL_TABLET | Freq: Every day | ORAL | Status: DC
  Administered 2017-07-22 – 2017-07-23 (×2): 20 mg via ORAL
  Filled 2017-07-22 (×2): qty 1

## 2017-07-22 MED ORDER — POTASSIUM CHLORIDE IN NACL 20-0.9 MEQ/L-% IV SOLN
INTRAVENOUS | Status: DC
  Administered 2017-07-22 – 2017-07-24 (×3): via INTRAVENOUS
  Filled 2017-07-22 (×3): qty 1000

## 2017-07-22 MED ORDER — ORAL CARE MOUTH RINSE
15.0000 mL | Freq: Two times a day (BID) | OROMUCOSAL | Status: DC
  Administered 2017-07-22 – 2017-07-23 (×4): 15 mL via OROMUCOSAL

## 2017-07-22 MED ORDER — BISACODYL 5 MG PO TBEC
5.0000 mg | DELAYED_RELEASE_TABLET | Freq: Every day | ORAL | Status: DC
  Administered 2017-07-22 – 2017-07-23 (×2): 5 mg via ORAL
  Filled 2017-07-22 (×2): qty 1

## 2017-07-22 MED ORDER — ONDANSETRON HCL 4 MG/2ML IJ SOLN: 4.0000 mg | Freq: Four times a day (QID) | INTRAMUSCULAR | Status: DC | PRN

## 2017-07-22 MED ORDER — ENOXAPARIN SODIUM 40 MG/0.4ML ~~LOC~~ SOLN
40.0000 mg | SUBCUTANEOUS | Status: DC
  Administered 2017-07-22 – 2017-07-23 (×2): 40 mg via SUBCUTANEOUS
  Filled 2017-07-22 (×2): qty 0.4

## 2017-07-22 MED ORDER — FENTANYL CITRATE (PF) 100 MCG/2ML IJ SOLN: 25.0000 ug | INTRAMUSCULAR | Status: DC | PRN

## 2017-07-22 MED ORDER — CHLORHEXIDINE GLUCONATE 0.12 % MT SOLN
15.0000 mL | Freq: Two times a day (BID) | OROMUCOSAL | Status: DC
Start: 2017-07-22 — End: 2017-07-24
  Administered 2017-07-22 – 2017-07-23 (×3): 15 mL via OROMUCOSAL
  Filled 2017-07-22 (×4): qty 15

## 2017-07-22 MED ORDER — ACETAMINOPHEN 650 MG RE SUPP: 650.0000 mg | Freq: Four times a day (QID) | RECTAL | Status: DC | PRN

## 2017-07-22 MED ORDER — ONDANSETRON HCL 4 MG PO TABS: 4.0000 mg | ORAL_TABLET | Freq: Four times a day (QID) | ORAL | Status: DC | PRN

## 2017-07-22 MED ORDER — ACETAMINOPHEN 325 MG PO TABS: 650.0000 mg | ORAL_TABLET | Freq: Four times a day (QID) | ORAL | Status: DC | PRN

## 2017-07-22 MED ORDER — ONDANSETRON HCL 4 MG/2ML IJ SOLN: 4.0000 mg | Freq: Once | INTRAMUSCULAR | Status: DC | PRN

## 2017-07-22 NOTE — Progress Notes (Signed)
Initial Nutrition Assessment  DOCUMENTATION CODES:   Severe malnutrition in context of chronic illness  INTERVENTION:   Recommend check Mg and P levels  RD will order supplements when diet advanced; recommend Magic Cups and Vital Cuisine   Recommend Dysphagia 1 diet with nectar thick liquids per SLP evaluation   NUTRITION DIAGNOSIS:   Severe Malnutrition related to chronic illness(h/o CVA, dysphagia, volvulus ) as evidenced by severe muscle depletions, severe fat depletions.  GOAL:   Patient will meet greater than or equal to 90% of their needs  MONITOR:   Diet advancement, Labs, Weight trends, Skin, I & O's  REASON FOR ASSESSMENT:   Malnutrition Screening Tool    ASSESSMENT:   76 year old male with PMHx of HTN, sigmoid volvulus, CVA 2002 who is now completely dependent with ADLs, aphasia, recent admit and discharge for cardiac arrest and [facility]. Now admitted with sigmoid volvulus    Pt recently discharged 12/10 and readmitted for sigmoid volvulus now s/p decompression by GI 12/11. Pt currently NPO. Pt s/p CSE on 12/10; recommended for dysphagia 1 diet with nectar thick liquids by SLP. RD will add Magic Cups and Vital cuisine once diet advanced. Pt likely at refeeding risk; recommend check Mg and P. Recommend replete electrolytes as needed per MD discretion.   Medications reviewed and include: lovenox, NaCl w/ KCl @50ml /hr  Labs reviewed: K 3.1(L), creat 0.38(L), Ca 8.6(L)  Nutrition-Focused physical exam completed. Findings are severe fat and muscle depletions over entire body, and mild edema BLE.   Diet Order:  Diet NPO time specified  EDUCATION NEEDS:   Not appropriate for education at this time  Skin:  Skin Assessment: (Stage I sacrum )  Last BM:  12/-10- type 7  Height:   Ht Readings from Last 1 Encounters:  07/21/17 5\' 11"  (1.803 m)    Weight:   Wt Readings from Last 1 Encounters:  07/21/17 101 lb (45.8 kg)    Ideal Body  Weight:  78.2 kg  BMI:  Body mass index is 14.09 kg/m.  Estimated Nutritional Needs:   Kcal:  1400-1600kcal/day   Protein:  70-80g/day   Fluid:  >1.4L/day   Betsey Holidayasey Angeni Chaudhuri MS, RD, LDN Pager #[phone] After Hours Pager: [phone]

## 2017-07-22 NOTE — Progress Notes (Signed)
Subjective: Patient seen for f/u sigmoid volvulus. Patient arousable, NAD. Nonverbal. Resting in no distress.   Objective: Vital signs in last 24 hours: Temp:  [97.4 F (36.3 C)-98.2 F (36.8 C)] 97.6 F (36.4 C) (12/12 1156) Pulse Rate:  [51-75] 59 (12/12 1156) Resp:  [11-20] 18 (12/12 0750) BP: (112-166)/(54-95) 132/69 (12/12 1156) SpO2:  [91 %-100 %] 100 % (12/12 1156) Weight:  [45.8 kg (101 lb)] 45.8 kg (101 lb) (12/11 1958) Blood pressure 132/69, pulse (!) 59, temperature 97.6 F (36.4 C), temperature source Oral, resp. rate 18, height 5\' 11"  (1.803 m), weight 45.8 kg (101 lb), SpO2 100 %.   Intake/Output from previous day: 12/11 0701 - 12/12 0700 In: 623 [I.V.:623] Out: -   Intake/Output this shift: No intake/output data recorded.   General appearance:  Arousable, weak appearing Resp:  Decr breath sounds Cardio: RR nl S1, S2 GI:  Abd soft, flat, nondistended. BS+ Extremities: No edema.   KUB: Distended colon, no free air.   Lab Results: Results for orders placed or performed during the hospital encounter of 07/21/17 (from the past 24 hour(s))  CBC with Differential/Platelet     Status: Abnormal   Collection Time: 07/21/17  8:05 PM  Result Value Ref Range   WBC 7.1 3.8 - 10.6 K/uL   RBC 4.11 (L) 4.40 - 5.90 MIL/uL   Hemoglobin 12.7 (L) 13.0 - 18.0 g/dL   HCT 16.137.7 (L) 09.640.0 - 04.552.0 %   MCV 91.6 80.0 - 100.0 fL   MCH 30.9 26.0 - 34.0 pg   MCHC 33.8 32.0 - 36.0 g/dL   RDW 40.914.0 81.111.5 - 91.414.5 %   Platelets 145 (L) 150 - 440 K/uL   Neutrophils Relative % 72 %   Neutro Abs 5.2 1.4 - 6.5 K/uL   Lymphocytes Relative 19 %   Lymphs Abs 1.3 1.0 - 3.6 K/uL   Monocytes Relative 6 %   Monocytes Absolute 0.4 0.2 - 1.0 K/uL   Eosinophils Relative 2 %   Eosinophils Absolute 0.2 0 - 0.7 K/uL   Basophils Relative 1 %   Basophils Absolute 0.0 0 - 0.1 K/uL  Comprehensive metabolic panel     Status: Abnormal   Collection Time: 07/21/17  8:05 PM  Result Value Ref Range    Sodium 143 135 - 145 mmol/L   Potassium 3.2 (L) 3.5 - 5.1 mmol/L   Chloride 102 101 - 111 mmol/L   CO2 32 22 - 32 mmol/L   Glucose, Bld 119 (H) 65 - 99 mg/dL   BUN 8 6 - 20 mg/dL   Creatinine, Ser 7.820.46 (L) 0.61 - 1.24 mg/dL   Calcium 8.8 (L) 8.9 - 10.3 mg/dL   Total Protein 7.2 6.5 - 8.1 g/dL   Albumin 3.8 3.5 - 5.0 g/dL   AST 29 15 - 41 U/L   ALT 19 17 - 63 U/L   Alkaline Phosphatase 82 38 - 126 U/L   Total Bilirubin 0.7 0.3 - 1.2 mg/dL   GFR calc non Af Amer >60 >60 mL/min   GFR calc Af Amer >60 >60 mL/min   Anion gap 9 5 - 15  Troponin I     Status: None   Collection Time: 07/21/17  8:05 PM  Result Value Ref Range   Troponin I <0.03 <0.03 ng/mL  Basic metabolic panel     Status: Abnormal   Collection Time: 07/22/17  3:48 AM  Result Value Ref Range   Sodium 144 135 - 145 mmol/L   Potassium  3.1 (L) 3.5 - 5.1 mmol/L   Chloride 103 101 - 111 mmol/L   CO2 32 22 - 32 mmol/L   Glucose, Bld 71 65 - 99 mg/dL   BUN 7 6 - 20 mg/dL   Creatinine, Ser 4.090.38 (L) 0.61 - 1.24 mg/dL   Calcium 8.6 (L) 8.9 - 10.3 mg/dL   GFR calc non Af Amer >60 >60 mL/min   GFR calc Af Amer >60 >60 mL/min   Anion gap 9 5 - 15  CBC     Status: Abnormal   Collection Time: 07/22/17  3:48 AM  Result Value Ref Range   WBC 8.1 3.8 - 10.6 K/uL   RBC 4.03 (L) 4.40 - 5.90 MIL/uL   Hemoglobin 12.7 (L) 13.0 - 18.0 g/dL   HCT 81.137.6 (L) 91.440.0 - 78.252.0 %   MCV 93.2 80.0 - 100.0 fL   MCH 31.5 26.0 - 34.0 pg   MCHC 33.8 32.0 - 36.0 g/dL   RDW 95.614.0 21.311.5 - 08.614.5 %   Platelets 119 (L) 150 - 440 K/uL     Recent Labs    07/21/17 2005 07/22/17 0348  WBC 7.1 8.1  HGB 12.7* 12.7*  HCT 37.7* 37.6*  PLT 145* 119*   BMET Recent Labs    07/20/17 0422 07/21/17 2005 07/22/17 0348  NA 146* 143 144  K 3.5 3.2* 3.1*  CL 105 102 103  CO2 34* 32 32  GLUCOSE 90 119* 71  BUN 7 8 7   CREATININE 0.39* 0.46* 0.38*  CALCIUM 8.3* 8.8* 8.6*   LFT Recent Labs    07/21/17 2005  PROT 7.2  ALBUMIN 3.8  AST 29  ALT 19   ALKPHOS 82  BILITOT 0.7   PT/INR No results for input(s): LABPROT, INR in the last 72 hours. Hepatitis Panel No results for input(s): HEPBSAG, HCVAB, HEPAIGM, HEPBIGM in the last 72 hours. C-Diff No results for input(s): CDIFFTOX in the last 72 hours. No results for input(s): CDIFFPCR in the last 72 hours.   Studies/Results: Ct Abdomen Pelvis Wo Contrast  Result Date: 07/21/2017 CLINICAL DATA:  Acute onset of generalized abdominal distention. Status post cardiac arrest. EXAM: CT ABDOMEN AND PELVIS WITHOUT CONTRAST TECHNIQUE: Multidetector CT imaging of the abdomen and pelvis was performed following the standard protocol without IV contrast. COMPARISON:  CT of the abdomen and pelvis from 06/15/2017 FINDINGS: Lower chest: Minimal scarring is noted at the lung bases. Scattered coronary artery calcifications are seen. A 5 mm nodule is noted at the right middle lobe (image 1 of 21), grossly stable from 2017 and likely benign. A calcified right-sided pleural plaque is also noted. Hepatobiliary: The liver is grossly unremarkable in appearance. Stones are seen filling the gallbladder. The common bile duct is not well characterized but appears grossly unremarkable. Pancreas: The pancreas is grossly unremarkable in appearance. Spleen: The spleen is unremarkable in appearance. Adrenals/Urinary Tract: The adrenal glands are grossly unremarkable. Nonspecific perinephric stranding is noted bilaterally, more prominent on the left. A few small nonobstructing right renal stones are noted. There is no evidence of hydronephrosis. No obstructing ureteral stones are identified. Stomach/Bowel: The stomach is unremarkable in appearance. The small bowel is within normal limits. The appendix is normal in caliber, without evidence of appendicitis. There is diffuse distention of the colon with air and fluid, as on the prior study. 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 Diffuse distention  of the colon extends along its entire course, unlike the typical sigmoid volvulus, with air-filled loops of distal small bowel also seen. Vascular/Lymphatic: Scattered calcification is seen along the abdominal aorta and its branches. The abdominal aorta is otherwise grossly unremarkable. The inferior vena cava is grossly unremarkable. No retroperitoneal lymphadenopathy is seen. No pelvic sidewall lymphadenopathy is identified. Reproductive: The bladder is moderately distended and grossly unremarkable. The prostate is enlarged, measuring 5.3 cm in transverse dimension. Other: No additional soft tissue abnormalities are seen. Musculoskeletal: No acute osseous abnormalities are identified. The visualized musculature is unremarkable in appearance. IMPRESSION: 1. 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 Unlike the typical sigmoid volvulus, diffuse distention of the colon extends along its entire course, with air-filled loops of distal small bowel also seen. 2. Scattered coronary artery calcifications noted. 3. Calcified right-sided pleural plaque noted, raising question for prior asbestos exposure. 4. Cholelithiasis.  Gallbladder otherwise grossly unremarkable. 5. Small nonobstructing right renal stones noted. 6. Enlarged prostate seen. Aortic Atherosclerosis (ICD10-I70.0). Electronically Signed   By: Roanna Raider M.D.   On: 07/21/2017 21:34   Dg Abd 1 View  Result Date: 07/22/2017 CLINICAL DATA:  Sigmoid volvulus EXAM: ABDOMEN - 1 VIEW COMPARISON:  CT 07/21/2017, radiography 06/17/2017 FINDINGS: Moderate gaseous distention of the colon persists. No free air is evident. IMPRESSION: Persistent gaseous distention of the colon.  No free air. Electronically Signed   By: Ellery Plunk M.D.    On: 07/22/2017 00:35   Dg Chest Portable 1 View  Result Date: 07/21/2017 CLINICAL DATA:  Abdominal distention. EXAM: PORTABLE CHEST 1 VIEW COMPARISON:  Cyst PA and lateral chest 07/18/2017. Single-view of the chest 02/01/2017. Two views of the abdomen 06/16/2017. FINDINGS: Lungs are clear. Heart size is normal. No pneumothorax or pleural effusion. Gaseous distention of colon in the upper abdomen does not appear markedly changed. IMPRESSION: No acute disease. Chronic gaseous distention of the colon. Electronically Signed   By: Drusilla Kanner M.D.   On: 07/21/2017 21:02    Scheduled Inpatient Medications:   . aspirin  81 mg Oral Daily  . atorvastatin  20 mg Oral q1800  . bisacodyl  5 mg Oral Daily  . chlorhexidine  15 mL Mouth Rinse BID  . enoxaparin (LOVENOX) injection  40 mg Subcutaneous Q24H  . mouth rinse  15 mL Mouth Rinse q12n4p  . senna-docusate  1 tablet Oral BID    Continuous Inpatient Infusions:   . 0.9 % NaCl with KCl 20 mEq / L 50 mL/hr at 07/22/17 0827    PRN Inpatient Medications:  acetaminophen **OR** acetaminophen, ondansetron **OR** ondansetron (ZOFRAN) IV, ondansetron (ZOFRAN) IV, oxyCODONE  Miscellaneous:   Assessment:  1. Sigmoid volvulus, s/p flexible sigmoidoscopy with reduction.  Plan:  1. Poor surgical candidate. 2. Resume diet  3. Ok to discharge home if tolerates po. 4. GI sign off. Call back if needed.  Kroy Sprung K. Norma Fredrickson, M.D. 07/22/2017, 3:54 PM

## 2017-07-22 NOTE — Progress Notes (Signed)
Patient ID: Bill [name], male   DOB: 04/06/1941, 76 y.o.   MRN: [id number]  [employer] PROGRESS NOTE  Bill [name] [id number] DOB: 07/06/1941 DOA: 07/21/2017 PCP: [place], [facility]  HPI/Subjective: Patient able to shake his head yes or no to questions.  Unable to talk.  Shakes his head yes that he is able to understand me.  Objective: Vitals:   07/22/17 0750 07/22/17 1156  BP: 133/67 132/69  Pulse: 60 (!) 59  Resp: 18   Temp: (!) 97.5 F (36.4 C) 97.6 F (36.4 C)  SpO2: 100% 100%    Filed Weights   07/21/17 1958  Weight: 45.8 kg (101 lb)    ROS: Review of Systems  Unable to perform ROS: Patient nonverbal  Respiratory: Negative for shortness of breath.   Cardiovascular: Negative for chest pain.  Gastrointestinal: Negative for abdominal pain.   Exam: Physical Exam  HENT:  Nose: No mucosal edema.  Mouth/Throat: No oropharyngeal exudate or posterior oropharyngeal edema.  Eyes: Conjunctivae, EOM and lids are normal. Pupils are equal, round, and reactive to light.  Neck: No JVD present. Carotid bruit is not present. No edema present. No thyroid mass and no thyromegaly present.  Cardiovascular: S1 normal and S2 normal. Exam reveals no gallop.  No murmur heard. Pulses:      Dorsalis pedis pulses are 2+ on the right side, and 2+ on the left side.  Respiratory: No respiratory distress. He has no wheezes. He has no rhonchi. He has no rales.  GI: Soft. Bowel sounds are normal. There is no tenderness.  Musculoskeletal:       Right ankle: He exhibits swelling.       Left ankle: He exhibits swelling.  Lymphadenopathy:    He has no cervical adenopathy.  Neurological: He is alert.  Skin: Skin is warm. Nails show no clubbing.  Slight redness on the buttock  Psychiatric:  Shakes his head to yes or no questions      Data Reviewed: Basic Metabolic Panel: Recent Labs  Lab 07/19/17 0006 07/19/17 0856 07/20/17 0422 07/21/17 2005 07/22/17 0348  NA  146* 146* 146* 143 144  K 2.6* 3.6 3.5 3.2* 3.1*  CL 97* 101 105 102 103  CO2 35* 34* 34* 32 32  GLUCOSE 138* 115* 90 119* 71  BUN 12 9 7 8 7   CREATININE 0.57* 0.45* 0.39* 0.46* 0.38*  CALCIUM 8.7* 8.4* 8.3* 8.8* 8.6*  MG 1.7  --   --   --   --    Liver Function Tests: Recent Labs  Lab 07/18/17 1829 07/21/17 2005  AST 43* 29  ALT 20 19  ALKPHOS 72 82  BILITOT 0.6 0.7  PROT 7.1 7.2  ALBUMIN 3.8 3.8   CBC: Recent Labs  Lab 07/18/17 1829 07/21/17 2005 07/22/17 0348  WBC 9.6 7.1 8.1  NEUTROABS 7.4* 5.2  --   HGB 14.5 12.7* 12.7*  HCT 43.5 37.7* 37.6*  MCV 94.9 91.6 93.2  PLT 173 145* 119*   Cardiac Enzymes: Recent Labs  Lab 07/18/17 1829 07/21/17 2005  TROPONINI <0.03 <0.03     Recent Results (from the past 240 hour(s))  MRSA PCR Screening     Status: None   Collection Time: 07/19/17 12:19 AM  Result Value Ref Range Status   MRSA by PCR NEGATIVE NEGATIVE Final    Comment:        The GeneXpert MRSA Assay (FDA approved for NASAL specimens only), is one component of a comprehensive MRSA  colonization surveillance program. It is not intended to diagnose MRSA infection nor to guide or monitor treatment for MRSA infections.      Studies: Ct Abdomen Pelvis Wo Contrast  Result Date: 07/21/2017 CLINICAL DATA:  Acute onset of generalized abdominal distention. Status post cardiac arrest. EXAM: CT ABDOMEN AND PELVIS WITHOUT CONTRAST TECHNIQUE: Multidetector CT imaging of the abdomen and pelvis was performed following the standard protocol without IV contrast. COMPARISON:  CT of the abdomen and pelvis from 06/15/2017 FINDINGS: Lower chest: Minimal scarring is noted at the lung bases. Scattered coronary artery calcifications are seen. A 5 mm nodule is noted at the right middle lobe (image 1 of 21), grossly stable from 2017 and likely benign. A calcified right-sided pleural plaque is also noted. Hepatobiliary: The liver is grossly unremarkable in appearance. Stones are  seen filling the gallbladder. The common bile duct is not well characterized but appears grossly unremarkable. Pancreas: The pancreas is grossly unremarkable in appearance. Spleen: The spleen is unremarkable in appearance. Adrenals/Urinary Tract: The adrenal glands are grossly unremarkable. Nonspecific perinephric stranding is noted bilaterally, more prominent on the left. A few small nonobstructing right renal stones are noted. There is no evidence of hydronephrosis. No obstructing ureteral stones are identified. Stomach/Bowel: The stomach is unremarkable in appearance. The small bowel is within normal limits. The appendix is normal in caliber, without evidence of appendicitis. There is diffuse distention of the colon with air and fluid, as on the prior study. 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 Diffuse distention of the colon extends along its entire course, unlike the typical sigmoid volvulus, with air-filled loops of distal small bowel also seen. Vascular/Lymphatic: Scattered calcification is seen along the abdominal aorta and its branches. The abdominal aorta is otherwise grossly unremarkable. The inferior vena cava is grossly unremarkable. No retroperitoneal lymphadenopathy is seen. No pelvic sidewall lymphadenopathy is identified. Reproductive: The bladder is moderately distended and grossly unremarkable. The prostate is enlarged, measuring 5.3 cm in transverse dimension. Other: No additional soft tissue abnormalities are seen. Musculoskeletal: No acute osseous abnormalities are identified. The visualized musculature is unremarkable in appearance. IMPRESSION: 1. 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 Unlike the typical sigmoid volvulus, diffuse  distention of the colon extends along its entire course, with air-filled loops of distal small bowel also seen. 2. Scattered coronary artery calcifications noted. 3. Calcified right-sided pleural plaque noted, raising question for prior asbestos exposure. 4. Cholelithiasis.  Gallbladder otherwise grossly unremarkable. 5. Small nonobstructing right renal stones noted. 6. Enlarged prostate seen. Aortic Atherosclerosis (ICD10-I70.0). Electronically Signed   By: Roanna Raider M.D.   On: 07/21/2017 21:34   Dg Abd 1 View  Result Date: 07/22/2017 CLINICAL DATA:  Sigmoid volvulus EXAM: ABDOMEN - 1 VIEW COMPARISON:  CT 07/21/2017, radiography 06/17/2017 FINDINGS: Moderate gaseous distention of the colon persists. No free air is evident. IMPRESSION: Persistent gaseous distention of the colon.  No free air. Electronically Signed   By: Ellery Plunk M.D.   On: 07/22/2017 00:35   Dg Chest Portable 1 View  Result Date: 07/21/2017 CLINICAL DATA:  Abdominal distention. EXAM: PORTABLE CHEST 1 VIEW COMPARISON:  Cyst PA and lateral chest 07/18/2017. Single-view of the chest 02/01/2017. Two views of the abdomen 06/16/2017. FINDINGS: Lungs are clear. Heart size is normal. No pneumothorax or  pleural effusion. Gaseous distention of colon in the upper abdomen does not appear markedly changed. IMPRESSION: No acute disease. Chronic gaseous distention of the colon. Electronically Signed   By: Drusilla Kannerhomas  Dalessio M.D.   On: 07/21/2017 21:02    Scheduled Meds: . aspirin  81 mg Oral Daily  . atorvastatin  20 mg Oral q1800  . bisacodyl  5 mg Oral Daily  . chlorhexidine  15 mL Mouth Rinse BID  . enoxaparin (LOVENOX) injection  40 mg Subcutaneous Q24H  . mouth rinse  15 mL Mouth Rinse q12n4p  . senna-docusate  1 tablet Oral BID   Continuous Infusions: . 0.9 % NaCl with KCl 20 mEq / L 50 mL/hr at 07/22/17 0827    Assessment/Plan:  1. Sigmoid volvulus status post decompression.  Put on diet today and see how he does.   Would like to see bowel movements. 2. Hypokalemia replace potassium and IV fluids 3. Severe malnutrition.  Check magnesium potassium and phosphorus tomorrow morning. 4. History of stroke and aphasia.  Aspirin and atorvastatin  Code Status:     Code Status Orders  (From admission, onward)        Start     Ordered   07/22/17 0040  Full code  Continuous     07/22/17 0039    Code Status History    Date Active Date Inactive Code Status Order ID Comments User Context   07/18/2017 23:59 07/20/2017 18:36 Full Code [id number]  Bertrum SolSalary, Montell D, MD Inpatient   06/15/2017 21:30 06/17/2017 19:26 Full Code [id number]  Shaune Pollackhen, Qing, MD Inpatient   02/01/2017 19:45 02/02/2017 20:00 Full Code [id number]  Oralia ManisWillis, David, MD Inpatient   04/14/2016 01:48 04/23/2016 16:58 Full Code [id number]  Jonah BlueYates, Jennifer, MD Inpatient     Family Communication: Spoke with wife on the phone Disposition Plan: Potentially back to facility tomorrow  Consultants:  Gastroenterology  Time spent: 28 minutes  Kenzy Campoverde [employer]  [employer]

## 2017-07-23 LAB — GLUCOSE, CAPILLARY
GLUCOSE-CAPILLARY: 42 mg/dL — AB (ref 65–99)
GLUCOSE-CAPILLARY: 77 mg/dL (ref 65–99)
GLUCOSE-CAPILLARY: 67 mg/dL (ref 65–99)
GLUCOSE-CAPILLARY: 76 mg/dL (ref 65–99)
GLUCOSE-CAPILLARY: 126 mg/dL — AB (ref 65–99)
Glucose-Capillary: 69 mg/dL (ref 65–99)
Glucose-Capillary: 97 mg/dL (ref 65–99)
Glucose-Capillary: 92 mg/dL (ref 65–99)

## 2017-07-23 LAB — BASIC METABOLIC PANEL
ANION GAP: 6 (ref 5–15)
BUN: 8 mg/dL (ref 6–20)
CALCIUM: 8.3 mg/dL — AB (ref 8.9–10.3)
CO2: 32 mmol/L (ref 22–32)
Chloride: 104 mmol/L (ref 101–111)
Creatinine, Ser: 0.37 mg/dL — ABNORMAL LOW (ref 0.61–1.24)
GLUCOSE: 69 mg/dL (ref 65–99)
POTASSIUM: 3.1 mmol/L — AB (ref 3.5–5.1)
SODIUM: 142 mmol/L (ref 135–145)

## 2017-07-23 LAB — PHOSPHORUS: PHOSPHORUS: 3.1 mg/dL (ref 2.5–4.6)

## 2017-07-23 LAB — MAGNESIUM: MAGNESIUM: 1.5 mg/dL — AB (ref 1.7–2.4)

## 2017-07-23 MED ORDER — DEXTROSE 50 % IV SOLN
1.0000 | Freq: Once | INTRAVENOUS | Status: AC
  Administered 2017-07-23: 50 mL via INTRAVENOUS
  Filled 2017-07-23: qty 50

## 2017-07-23 MED ORDER — POTASSIUM CHLORIDE CRYS ER 20 MEQ PO TBCR
40.0000 meq | EXTENDED_RELEASE_TABLET | Freq: Once | ORAL | Status: AC
  Administered 2017-07-23: 40 meq via ORAL
  Filled 2017-07-23: qty 2

## 2017-07-23 MED ORDER — BISACODYL 5 MG PO TBEC: 5.0000 mg | DELAYED_RELEASE_TABLET | Freq: Once | ORAL | Status: DC

## 2017-07-23 MED ORDER — MAGNESIUM SULFATE 2 GM/50ML IV SOLN
2.0000 g | INTRAVENOUS | Status: AC
  Administered 2017-07-23: 2 g via INTRAVENOUS
  Filled 2017-07-23: qty 50

## 2017-07-23 MED ORDER — LUBIPROSTONE 24 MCG PO CAPS
24.0000 ug | ORAL_CAPSULE | Freq: Two times a day (BID) | ORAL | Status: DC
  Filled 2017-07-23 (×4): qty 1

## 2017-07-23 MED ORDER — DEXTROSE 50 % IV SOLN
INTRAVENOUS | Status: AC
  Administered 2017-07-23: 25 mL
  Filled 2017-07-23: qty 50

## 2017-07-23 NOTE — Clinical Social Work Note (Signed)
[facility]. York SpanielMonica Danyell Shader MSW,LCSW [phone]

## 2017-07-23 NOTE — Progress Notes (Signed)
Per Dr. Renae GlossWieting okay to change patient to q 4 fingersticks. Notified MD of blood glucose of 67. Per MD give amp of dextrose. Pt had BM so okay to hold amitiza dose this afternoon.

## 2017-07-23 NOTE — Progress Notes (Signed)
Notified Dr. Renae GlossWieting that Patient was spitting out morning meds. Per MD give patient another 1 time dose of oral dulcolax.

## 2017-07-23 NOTE — Clinical Social Work Note (Addendum)
Patient was admitted less than 24 hours after discharging. The following is the CSW assessment was placed on 12/10 and patient readmitted on 12/11.  Clinical Social Work Assessment  Patient Details  Name: Bill [name] MRN: [id number] Date of Birth: 06/26/1941  Date of referral:  07/20/17               Reason for consult:  Discharge Planning                           Permission sought to share information with:    Permission granted to share information::                Name::                   Agency::                Relationship::                Contact Information:     Housing/Transportation Living arrangements for the past 2 months:    Source of Information:  Spouse Patient Interpreter Needed:  None Criminal Activity/Legal Involvement Pertinent to Current Situation/Hospitalization:  No - Comment as needed Significant Relationships:  Spouse Lives with:  Facility Resident Do you feel safe going back to the place where you live?  Yes Need for family participation in patient care:  Yes (Comment)  Care giving concerns:  None, [facility].   Social Worker assessment / plan:  MD is discharging patient today. [facility] and [facility] EMS to transport. CSW spoke with patient's wife and she in agreement [facility].  Employment status:  Retired Health and safety inspectornsurance information:  Armed forces operational officerMedicare, [place]:  Skilled Nursing Facility Information / Referral to community resources:     Patient/Family's Response to care:  Patient's wife expressed appreciation for CSW assistance.  Patient/Family's Understanding of and Emotional Response to Diagnosis, Current Treatment, and Prognosis:  Patient's wife is involved in patient's discharge plan.   Emotional Assessment Appearance:  Appears stated age Attitude/Demeanor/Rapport:     Affect (typically observed):    Orientation:    Alcohol / Substance use:  Not Applicable Psych involvement (Current and /or in the community):  No (Comment)  Discharge Needs  Concerns to be addressed:  Care Coordination Readmission within the last 30 days:  No Current discharge risk:  None Barriers to Discharge:  No Barriers Identified   York SpanielMonica [place] Millon, LCSW 07/20/2017, 11:27 AM

## 2017-07-23 NOTE — Progress Notes (Signed)
Patient ID: Bill [name], male   DOB: 12/15/1940, 76 y.o.   MRN: [id number]   [employer] PROGRESS NOTE  Bill [name] WUJ:[id number] DOB: 06/16/1941 DOA: 07/21/2017 PCP: [place], [facility]  HPI/Subjective: Patient able to shake his head yes or no to questions.  Unable to talk.    Objective: Vitals:   07/23/17 0537 07/23/17 1215  BP: 129/62 (!) 148/65  Pulse: 66 (!) 115  Resp: 20 14  Temp: 98.3 F (36.8 C) 98.2 F (36.8 C)  SpO2: 100% 91%    Filed Weights   07/21/17 1958  Weight: 45.8 kg (101 lb)    ROS: Review of Systems  Unable to perform ROS: Patient nonverbal  Respiratory: Negative for shortness of breath.   Cardiovascular: Negative for chest pain.  Gastrointestinal: Negative for abdominal pain.   Exam: Physical Exam  HENT:  Nose: No mucosal edema.  Mouth/Throat: No oropharyngeal exudate or posterior oropharyngeal edema.  Eyes: Conjunctivae, EOM and lids are normal. Pupils are equal, round, and reactive to light.  Neck: No JVD present. Carotid bruit is not present. No edema present. No thyroid mass and no thyromegaly present.  Cardiovascular: S1 normal and S2 normal. Exam reveals no gallop.  No murmur heard. Pulses:      Dorsalis pedis pulses are 2+ on the right side, and 2+ on the left side.  Respiratory: No respiratory distress. He has no wheezes. He has no rhonchi. He has no rales.  GI: Soft. Bowel sounds are normal. There is no tenderness.  Musculoskeletal:       Right ankle: He exhibits swelling.       Left ankle: He exhibits swelling.  Lymphadenopathy:    He has no cervical adenopathy.  Neurological: He is alert.  Skin: Skin is warm. Nails show no clubbing.  Slight redness on the buttock  Psychiatric:  Shakes his head to yes or no questions      Data Reviewed: Basic Metabolic Panel: Recent Labs  Lab 07/19/17 0006 07/19/17 0856 07/20/17 0422 07/21/17 2005 07/22/17 0348 07/23/17 0353  NA 146* 146* 146* 143 144 142  K 2.6*  3.6 3.5 3.2* 3.1* 3.1*  CL 97* 101 105 102 103 104  CO2 35* 34* 34* 32 32 32  GLUCOSE 138* 115* 90 119* 71 69  BUN 12 9 7 8 7 8   CREATININE 0.57* 0.45* 0.39* 0.46* 0.38* 0.37*  CALCIUM 8.7* 8.4* 8.3* 8.8* 8.6* 8.3*  MG 1.7  --   --   --   --  1.5*  PHOS  --   --   --   --   --  3.1   Liver Function Tests: Recent Labs  Lab 07/18/17 1829 07/21/17 2005  AST 43* 29  ALT 20 19  ALKPHOS 72 82  BILITOT 0.6 0.7  PROT 7.1 7.2  ALBUMIN 3.8 3.8   CBC: Recent Labs  Lab 07/18/17 1829 07/21/17 2005 07/22/17 0348  WBC 9.6 7.1 8.1  NEUTROABS 7.4* 5.2  --   HGB 14.5 12.7* 12.7*  HCT 43.5 37.7* 37.6*  MCV 94.9 91.6 93.2  PLT 173 145* 119*   Cardiac Enzymes: Recent Labs  Lab 07/18/17 1829 07/21/17 2005  TROPONINI <0.03 <0.03     Recent Results (from the past 240 hour(s))  MRSA PCR Screening     Status: None   Collection Time: 07/19/17 12:19 AM  Result Value Ref Range Status   MRSA by PCR NEGATIVE NEGATIVE Final    Comment:  The GeneXpert MRSA Assay (FDA approved for NASAL specimens only), is one component of a comprehensive MRSA colonization surveillance program. It is not intended to diagnose MRSA infection nor to guide or monitor treatment for MRSA infections.      Studies: Ct Abdomen Pelvis Wo Contrast  Result Date: 07/21/2017 CLINICAL DATA:  Acute onset of generalized abdominal distention. Status post cardiac arrest. EXAM: CT ABDOMEN AND PELVIS WITHOUT CONTRAST TECHNIQUE: Multidetector CT imaging of the abdomen and pelvis was performed following the standard protocol without IV contrast. COMPARISON:  CT of the abdomen and pelvis from 06/15/2017 FINDINGS: Lower chest: Minimal scarring is noted at the lung bases. Scattered coronary artery calcifications are seen. A 5 mm nodule is noted at the right middle lobe (image 1 of 21), grossly stable from 2017 and likely benign. A calcified right-sided pleural plaque is also noted. Hepatobiliary: The liver is grossly  unremarkable in appearance. Stones are seen filling the gallbladder. The common bile duct is not well characterized but appears grossly unremarkable. Pancreas: The pancreas is grossly unremarkable in appearance. Spleen: The spleen is unremarkable in appearance. Adrenals/Urinary Tract: The adrenal glands are grossly unremarkable. Nonspecific perinephric stranding is noted bilaterally, more prominent on the left. A few small nonobstructing right renal stones are noted. There is no evidence of hydronephrosis. No obstructing ureteral stones are identified. Stomach/Bowel: The stomach is unremarkable in appearance. The small bowel is within normal limits. The appendix is normal in caliber, without evidence of appendicitis. There is diffuse distention of the colon with air and fluid, as on the prior study. 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 Diffuse distention of the colon extends along its entire course, unlike the typical sigmoid volvulus, with air-filled loops of distal small bowel also seen. Vascular/Lymphatic: Scattered calcification is seen along the abdominal aorta and its branches. The abdominal aorta is otherwise grossly unremarkable. The inferior vena cava is grossly unremarkable. No retroperitoneal lymphadenopathy is seen. No pelvic sidewall lymphadenopathy is identified. Reproductive: The bladder is moderately distended and grossly unremarkable. The prostate is enlarged, measuring 5.3 cm in transverse dimension. Other: No additional soft tissue abnormalities are seen. Musculoskeletal: No acute osseous abnormalities are identified. The visualized musculature is unremarkable in appearance. IMPRESSION: 1. 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 Unlike the  typical sigmoid volvulus, diffuse distention of the colon extends along its entire course, with air-filled loops of distal small bowel also seen. 2. Scattered coronary artery calcifications noted. 3. Calcified right-sided pleural plaque noted, raising question for prior asbestos exposure. 4. Cholelithiasis.  Gallbladder otherwise grossly unremarkable. 5. Small nonobstructing right renal stones noted. 6. Enlarged prostate seen. Aortic Atherosclerosis (ICD10-I70.0). Electronically Signed   By: Roanna Raider M.D.   On: 07/21/2017 21:34   Dg Abd 1 View  Result Date: 07/22/2017 CLINICAL DATA:  Sigmoid volvulus EXAM: ABDOMEN - 1 VIEW COMPARISON:  CT 07/21/2017, radiography 06/17/2017 FINDINGS: Moderate gaseous distention of the colon persists. No free air is evident. IMPRESSION: Persistent gaseous distention of the colon.  No free air. Electronically Signed   By: Ellery Plunk M.D.   On: 07/22/2017 00:35   Dg Chest Portable 1 View  Result Date: 07/21/2017 CLINICAL DATA:  Abdominal distention. EXAM: PORTABLE CHEST 1 VIEW COMPARISON:  Cyst PA and lateral chest 07/18/2017. Single-view of the chest 02/01/2017.  Two views of the abdomen 06/16/2017. FINDINGS: Lungs are clear. Heart size is normal. No pneumothorax or pleural effusion. Gaseous distention of colon in the upper abdomen does not appear markedly changed. IMPRESSION: No acute disease. Chronic gaseous distention of the colon. Electronically Signed   By: Drusilla Kannerhomas  Dalessio M.D.   On: 07/21/2017 21:02    Scheduled Meds: . aspirin  81 mg Oral Daily  . atorvastatin  20 mg Oral q1800  . bisacodyl  5 mg Oral Daily  . bisacodyl  5 mg Oral Once  . chlorhexidine  15 mL Mouth Rinse BID  . enoxaparin (LOVENOX) injection  40 mg Subcutaneous Q24H  . lubiprostone  24 mcg Oral BID WC  . mouth rinse  15 mL Mouth Rinse q12n4p  . senna-docusate  1 tablet Oral BID   Continuous Infusions: . 0.9 % NaCl with KCl 20 mEq / L 50 mL/hr at 07/23/17 0308     Assessment/Plan:  1. Sigmoid volvulus status post decompression.  Put on diet last night.  Did not eat much last night.  As per nurse was spitting out medications today. 2. Hypokalemia replace potassium and IV fluids and orally 3. Hypoglycemia with no history of diabetes.  Likely because he was n.p.o. prior to the procedure and did not eat much last night.  Sugars are little bit better after eating breakfast. 4. Hypomagnesemia replace magnesium IV 5. Severe malnutrition.  Palliative care consultation 6. History of stroke and aphasia.  Aspirin and atorvastatin 7. Discussed case with wife patient is a full code. 8. Potential disposition back to his facility tomorrow.  Code Status:     Code Status Orders  (From admission, onward)        Start     Ordered   07/22/17 0040  Full code  Continuous     07/22/17 0039    Code Status History    Date Active Date Inactive Code Status Order ID Comments User Context   07/18/2017 23:59 07/20/2017 18:36 Full Code [id number]  Bertrum SolSalary, Montell D, MD Inpatient   06/15/2017 21:30 06/17/2017 19:26 Full Code [id number]  Shaune Pollackhen, Qing, MD Inpatient   02/01/2017 19:45 02/02/2017 20:00 Full Code [id number]  Oralia ManisWillis, David, MD Inpatient   04/14/2016 01:48 04/23/2016 16:58 Full Code [id number]  Jonah BlueYates, Jennifer, MD Inpatient     Family Communication: Spoke with wife on the phone   Consultants:  Gastroenterology  Time spent: 28 minutes  Kristeen Lantz [employer]  [employer]

## 2017-07-24 LAB — BASIC METABOLIC PANEL
Anion gap: 4 — ABNORMAL LOW (ref 5–15)
BUN: 7 mg/dL (ref 6–20)
CALCIUM: 8.1 mg/dL — AB (ref 8.9–10.3)
CHLORIDE: 104 mmol/L (ref 101–111)
CO2: 32 mmol/L (ref 22–32)
CREATININE: 0.34 mg/dL — AB (ref 0.61–1.24)
Glucose, Bld: 99 mg/dL (ref 65–99)
Potassium: 3.6 mmol/L (ref 3.5–5.1)
SODIUM: 140 mmol/L (ref 135–145)

## 2017-07-24 LAB — GLUCOSE, CAPILLARY
GLUCOSE-CAPILLARY: 86 mg/dL (ref 65–99)
Glucose-Capillary: 73 mg/dL (ref 65–99)
Glucose-Capillary: 97 mg/dL (ref 65–99)

## 2017-07-24 LAB — PHOSPHORUS: Phosphorus: 2.7 mg/dL (ref 2.5–4.6)

## 2017-07-24 LAB — MAGNESIUM: MAGNESIUM: 1.9 mg/dL (ref 1.7–2.4)

## 2017-07-24 MED ORDER — CHLORHEXIDINE GLUCONATE 0.12 % MT SOLN: 15.0000 mL | Freq: Two times a day (BID) | OROMUCOSAL | 0 refills | Status: AC

## 2017-07-24 MED ORDER — MAGNESIUM OXIDE 400 (241.3 MG) MG PO TABS: 400.0000 mg | ORAL_TABLET | Freq: Every day | ORAL | 0 refills | Status: AC

## 2017-07-24 MED ORDER — BISACODYL 5 MG PO TBEC: 5.0000 mg | DELAYED_RELEASE_TABLET | Freq: Every day | ORAL | 0 refills | Status: AC | PRN

## 2017-07-24 MED ORDER — MAGNESIUM OXIDE 400 (241.3 MG) MG PO TABS: 400.0000 mg | ORAL_TABLET | Freq: Every day | ORAL | Status: DC

## 2017-07-24 MED ORDER — POTASSIUM CHLORIDE 20 MEQ PO PACK: 20.0000 meq | PACK | Freq: Every day | ORAL | 0 refills | Status: AC

## 2017-07-24 MED ORDER — POTASSIUM CHLORIDE 20 MEQ PO PACK: 20.0000 meq | PACK | Freq: Every day | ORAL | Status: DC

## 2017-07-24 MED ORDER — SENNA-DOCUSATE SODIUM 8.6-50 MG PO TABS: 1.0000 | ORAL_TABLET | Freq: Two times a day (BID) | ORAL | 0 refills | Status: AC

## 2017-07-24 NOTE — Progress Notes (Signed)
[facility]. [facility].

## 2017-07-24 NOTE — Discharge Summary (Signed)
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 Aphagia   . HTN (hypertension)   . Sigmoid volvulus (HCC)   . Stroke [facility]) 2002   completely dependent with ADLs/IADLs    HOSPITAL COURSE:   1.  Sigmoid volvulus status post decompression.  Patient had a bowel movement yesterday.  Ate yesterday.  But was spitting out medications. 2.  Hypokalemia.  Patient was replace potassium and IV fluids and orally.  Prescribed calor upon going home. 3.  Hypomagnesemia.  This was replaced IV.  Oral magnesium oxide upon discharge. 4.  Hypoglycemia with no history of diabetes.  This was likely because he was n.p.o. prior to the procedure and then after the procedure and did not eat that much.  He is eating a little bit better and sugars have been holding.  Anytime he has altered mental status please check a fingerstick.  Recommend checking a fingerstick daily. 5.  Severe malnutrition.  Recommend palliative care consultation at facility.  Patient is on dysphagia 1 with nectar thick liquids.  Hard to stay hydrated on this diet. 6.  History of stroke and aphasia on aspirin and atorvastatin 7.  Patient is a full code 8.  Recommend palliative care consultation at facility. 9.  Slight redness on the buttock need to change positions of him every 2 hours.  Also heel protectors while in bed. 10.  Thrombocytopenia.  Monitor as outpatient  DISCHARGE CONDITIONS:    Failure  CONSULTS OBTAINED:  Gastroenterology  DRUG ALLERGIES:  No Known Allergies  DISCHARGE MEDICATIONS:   Allergies as of 07/24/2017   No Known Allergies     Medication List    TAKE these medications   acetaminophen 325 MG tablet Commonly known as:  TYLENOL Take 650 mg by mouth every 6 (six) hours as needed.   aspirin 81 MG chewable tablet Chew 81 mg by mouth daily. Crush tablet   atorvastatin 20 MG tablet Commonly known as:  LIPITOR Take 1 tablet (20 mg total) by mouth daily at 6 PM.   bisacodyl 5 MG EC tablet Commonly known as:  DULCOLAX Take 1 tablet (5 mg total) by mouth daily as needed for moderate constipation.   chlorhexidine 0.12 % solution Commonly known as:  PERIDEX 15 mLs by Mouth Rinse route 2 (two) times daily.   magnesium oxide 400 (241.3 Mg) MG tablet Commonly known as:  MAG-OX Take 1 tablet (400 mg total) by mouth daily.   ondansetron 4 MG tablet Commonly known as:  ZOFRAN Take 1 tablet (4 mg total) by mouth every 6 (six) hours as needed for nausea.   potassium chloride 20 MEQ packet Commonly known as:  KLOR-CON Take 20 mEq by mouth daily.   scopolamine 1 MG/3DAYS Commonly known as:  TRANSDERM-SCOP Place 1 patch onto the skin every 3 (three) days.   sennosides-docusate sodium 8.6-50 MG tablet Commonly known as:  SENOKOT-S Take 1 tablet by mouth 2 (two) times daily at 10 AM  and 5 PM. What changed:  when to take this        DISCHARGE INSTRUCTIONS:   Follow-up with Dr. at facility 1 day Follow up with palliative care at facility  If you experience worsening of your admission symptoms, develop shortness of breath, life threatening emergency, suicidal or homicidal thoughts you must seek medical attention immediately by calling 911 or calling your MD immediately  if symptoms less severe.  You Must read complete instructions/literature along with all the possible adverse reactions/side effects for all the Medicines you take and that  have been prescribed to you. Take any new Medicines after you have completely understood and accept all the possible adverse reactions/side effects.   Please note  You were cared for by a hospitalist during your hospital stay. If you have any questions about your discharge medications or the care you received while you were in the hospital after you are discharged, you can call the unit and asked to speak with the hospitalist on call if the hospitalist that took care of you is not available. Once you are discharged, your primary care physician will handle any further medical issues. Please note that NO REFILLS for any discharge medications will be authorized once you are discharged, as it is imperative that you return to your primary care physician (or establish a relationship with a primary care physician if you do not have one) for your aftercare needs so that they can reassess your need for medications and monitor your lab values.    Today   CHIEF COMPLAINT:   Chief Complaint  Patient presents with  . Bloated    HISTORY OF PRESENT ILLNESS:  Bill [name]  is a 76 y.o. male was found to have a volvulus   VITAL SIGNS:  Blood pressure 119/72, pulse 67, temperature 98 F (36.7 C), temperature source Oral, resp. rate 18, height 5\' 11"  (1.803 m), weight 45.8 kg (101 lb), SpO2 96 %.    PHYSICAL EXAMINATION:  GENERAL:  76 y.o.-year-old patient lying in the bed with no acute distress.  EYES: Pupils equal, round, reactive to light and accommodation. No scleral icterus.  HEENT: Head atraumatic, normocephalic. Oropharynx and nasopharynx clear.  NECK:  Supple, no jugular venous distention. No thyroid enlargement, no tenderness.  LUNGS: Normal breath sounds bilaterally, no wheezing, rales,rhonchi or crepitation. No use of accessory muscles of respiration.  CARDIOVASCULAR: S1, S2 normal. No murmurs, rubs, or gallops.  ABDOMEN: Soft, non-tender, non-distended. Bowel sounds present. No  organomegaly or mass.  EXTREMITIES: No pedal edema, cyanosis, or clubbing.  NEUROLOGIC: Cranial nerves II through XII are intact. Muscle strength 5/5 in all extremities. Sensation intact. Gait not checked.  PSYCHIATRIC: The patient is alert and oriented x 3.  SKIN: Slight redness on the buttock.  DATA REVIEW:   CBC Recent Labs  Lab 07/22/17 0348  WBC 8.1  HGB 12.7*  HCT 37.6*  PLT 119*    Chemistries  Recent Labs  Lab 07/21/17 2005  07/24/17 0324  NA 143   < > 140  K 3.2*   < > 3.6  CL 102   < > 104  CO2 32   < > 32  GLUCOSE 119*   < > 99  BUN 8   < > 7  CREATININE 0.46*   < > 0.34*  CALCIUM 8.8*   < > 8.1*  MG  --    < > 1.9  AST 29  --   --   ALT 19  --   --  ALKPHOS 82  --   --   BILITOT 0.7  --   --    < > = values in this interval not displayed.    Cardiac Enzymes Recent Labs  Lab 07/21/17 2005  TROPONINI <0.03    Microbiology Results  Results for orders placed or performed during the hospital encounter of 07/18/17  MRSA PCR Screening     Status: None   Collection Time: 07/19/17 12:19 AM  Result Value Ref Range Status   MRSA by PCR NEGATIVE NEGATIVE Final    Comment:        The GeneXpert MRSA Assay (FDA approved for NASAL specimens only), is one component of a comprehensive MRSA colonization surveillance program. It is not intended to diagnose MRSA infection nor to guide or monitor treatment for MRSA infections.     Management plans discussed with the patient, family and they are in agreement.  CODE STATUS:     Code Status Orders  (From admission, onward)        Start     Ordered   07/22/17 0040  Full code  Continuous     07/22/17 0039    Code Status History    Date Active Date Inactive Code Status Order ID Comments User Context   07/18/2017 23:59 07/20/2017 18:36 Full Code [id number]  Bertrum SolSalary, Montell D, MD Inpatient   06/15/2017 21:30 06/17/2017 19:26 Full Code [id number]  Shaune Pollackhen, Qing, MD Inpatient   02/01/2017 19:45 02/02/2017 20:00  Full Code [id number]  Oralia ManisWillis, David, MD Inpatient   04/14/2016 01:48 04/23/2016 16:58 Full Code [id number]  Jonah BlueYates, Jennifer, MD Inpatient      TOTAL TIME TAKING CARE OF THIS PATIENT: 35 minutes.    Alford Highlandichard Shanai Lartigue M.D on 07/24/2017 at 7:51 AM  Between 7am to 6pm - Pager - [phone]  After 6pm go to [email or web address] - Social research officer, governmentpassword [email or web address]  [employer] Office  [phone]  CC: Primary care physician; [place], [facility]

## 2017-07-24 NOTE — Progress Notes (Signed)
Bill LentoWilliam M [name] . VSS. Pt tolerating diet well. No complaints of pain or nausea. IV removed intact, prescriptions given. Discharge instructions given to daughter (pt had a stroke in the past and it does not talk). She voiced understanding of discharge instructions with no further questions. [facility].    Allergies as of 07/24/2017   No Known Allergies     Medication List    TAKE these medications   acetaminophen 325 MG tablet Commonly known as:  TYLENOL Take 650 mg by mouth every 6 (six) hours as needed.   aspirin 81 MG chewable tablet Chew 81 mg by mouth daily. Crush tablet   atorvastatin 20 MG tablet Commonly known as:  LIPITOR Take 1 tablet (20 mg total) by mouth daily at 6 PM.   bisacodyl 5 MG EC tablet Commonly known as:  DULCOLAX Take 1 tablet (5 mg total) by mouth daily as needed for moderate constipation.   chlorhexidine 0.12 % solution Commonly known as:  PERIDEX 15 mLs by Mouth Rinse route 2 (two) times daily.   magnesium oxide 400 (241.3 Mg) MG tablet Commonly known as:  MAG-OX Take 1 tablet (400 mg total) by mouth daily.   ondansetron 4 MG tablet Commonly known as:  ZOFRAN Take 1 tablet (4 mg total) by mouth every 6 (six) hours as needed for nausea.   potassium chloride 20 MEQ packet Commonly known as:  KLOR-CON Take 20 mEq by mouth daily.   scopolamine 1 MG/3DAYS Commonly known as:  TRANSDERM-SCOP Place 1 patch onto the skin every 3 (three) days.   sennosides-docusate sodium 8.6-50 MG tablet Commonly known as:  SENOKOT-S Take 1 tablet by mouth 2 (two) times daily at 10 AM and 5 PM. What changed:  when to take this       Vitals:   07/24/17 0428 07/24/17 0843  BP: 119/72 128/73  Pulse: 67 70  Resp: 18   Temp: 98 F (36.7 C) (!) 96.7 F (35.9 C)  SpO2: 96% 96%    Bill [name]

## 2017-07-24 NOTE — Anesthesia Postprocedure Evaluation (Signed)
Anesthesia Post Note  Patient: Bill LentoWilliam M [name]  Procedure(s) Performed: FLEXIBLE SIGMOIDOSCOPY with reduction of sigmoid volvulus (N/A )  Patient location during evaluation: PACU Anesthesia Type: General Level of consciousness: awake and alert Pain management: pain level controlled Vital Signs Assessment: post-procedure vital signs reviewed and stable Respiratory status: spontaneous breathing, nonlabored ventilation, respiratory function stable and patient connected to nasal cannula oxygen Cardiovascular status: blood pressure returned to baseline and stable Postop Assessment: no apparent nausea or vomiting Anesthetic complications: no     Last Vitals:  Vitals:   07/24/17 0428 07/24/17 0843  BP: 119/72 128/73  Pulse: 67 70  Resp: 18   Temp: 36.7 C (!) 35.9 C  SpO2: 96% 96%    Last Pain:  Vitals:   07/24/17 0843  TempSrc: Axillary  PainSc:                  Yevette EdwardsJames G Jonathon Castelo

## 2017-07-24 NOTE — Discharge Instructions (Signed)
Volvulus  Volvulus is an abnormal twisting of a portion of your digestive tract. Your digestive tract consists of your swallowing tube (esophagus), followed by your stomach, small intestine, and large intestine. This twisting can block the flow of digestion (intestinal obstruction). It can also block the blood flow to the part of the digestive tract that is twisted. Lack of blood flow can cause the twisted part of the digestive tract to die. Volvulus is a medical emergency.  There are various types of volvulus:   Sigmoid volvulus is a twisting of the last part of your large intestine. This is the most common type.   Midgut volvulus usually occurs in children who are born with an abnormally positioned small intestine (malrotation).   Cecal volvulus may be caused by scar tissue from previous abdominal surgery.   Gastric volvulus is a rare type of volvulus that occurs when the stomach twists around itself.    What are the causes?  Volvulus may be caused by many different things. It can be something you are born with (congenital deformity), or it may be a problem that develops from another condition.  What increases the risk?  You may have a greater risk of volvulus if you:   Are 50 years of age or older.   Have long-standing (chronic) constipation.   Have part of your stomach located above the area where the stomach and esophagus meet (paraesophageal hernia).   Are bedridden.   Have had previous abdominal surgery.   Live in a long-term care facility.    What are the signs or symptoms?  Signs and symptoms of most types of volvulus include:   Abdominal pain.  ? Sigmoid volvulus may cause pain in the lower left part of the abdomen.  ? Cecal volvulus may cause pain in the lower right part of the abdomen.  ? Gastric and midgut volvulus may cause pain in the upper abdomen.   Bloating and swelling of the abdomen.   Decreased passing of gas or inability to pass  gas.   Nausea.   Vomiting.   Constipation.   Tenderness when pressing on the abdomen.    As the condition gets worse, the volvulus can develop a hole (perforation) and leak digestive contents into the abdomen. This can cause late symptoms of volvulus, including:   Severe infection (sepsis).   Bleeding into the abdomen.   Very low blood pressure (shock).    How is this diagnosed?  Your health care provider may suspect volvulus if you have sudden signs and symptoms of intestinal obstruction. A physical exam will be done. The health care provider will listen to your abdomen for the sounds of digestion and will feel your abdomen for tenderness. Imaging studies of your abdomen may also be done. These may include:   CT scan. This is the best imaging study for diagnosing volvulus.   Plain X-rays. These may show air and fluid levels and widening above the obstruction.   Ultrasound.    How is this treated?  Volvulus is almost always a medical emergency requiring immediate surgery. A surgeon may attempt to do a procedure to untwist the volvulus if possible. If the volvulus cannot be untwisted, the part of the digestive tract involved may need to be removed.  This information is not intended to replace advice given to you by your health care provider. Make sure you discuss any questions you have with your health care provider.  Document Released: 04/22/2001 Document Revised: 01/03/2016 Document Reviewed: 04/12/2014    Elsevier Interactive Patient Education  2018 [employer]

## 2017-08-24 ENCOUNTER — Emergency Department
Admission: EM | Admit: 2017-08-24 | Discharge: 2017-08-24 | Disposition: A | Discharge status: Home / Self Care | Payer: Medicare Other | Attending: Emergency Medicine | Admitting: Emergency Medicine

## 2017-08-24 ENCOUNTER — Emergency Department: Payer: Medicare Other

## 2017-08-24 ENCOUNTER — Encounter: Payer: Self-pay | Admitting: Emergency Medicine

## 2017-08-24 DIAGNOSIS — Z79899 Other long term (current) drug therapy: Secondary | ICD-10-CM | POA: Insufficient documentation

## 2017-08-24 DIAGNOSIS — E162 Hypoglycemia, unspecified: Secondary | ICD-10-CM | POA: Insufficient documentation

## 2017-08-24 DIAGNOSIS — Z7982 Long term (current) use of aspirin: Secondary | ICD-10-CM | POA: Insufficient documentation

## 2017-08-24 DIAGNOSIS — R531 Weakness: Secondary | ICD-10-CM | POA: Diagnosis present

## 2017-08-24 DIAGNOSIS — E860 Dehydration: Secondary | ICD-10-CM | POA: Diagnosis not present

## 2017-08-24 DIAGNOSIS — I10 Essential (primary) hypertension: Secondary | ICD-10-CM | POA: Diagnosis not present

## 2017-08-24 DIAGNOSIS — Z8673 Personal history of transient ischemic attack (TIA), and cerebral infarction without residual deficits: Secondary | ICD-10-CM | POA: Insufficient documentation

## 2017-08-24 DIAGNOSIS — R4182 Altered mental status, unspecified: Secondary | ICD-10-CM | POA: Insufficient documentation

## 2017-08-24 LAB — GLUCOSE, CAPILLARY
Glucose-Capillary: 192 mg/dL — ABNORMAL HIGH (ref 65–99)
Glucose-Capillary: 34 mg/dL — CL (ref 65–99)
Glucose-Capillary: 88 mg/dL (ref 65–99)
Glucose-Capillary: 109 mg/dL — ABNORMAL HIGH (ref 65–99)

## 2017-08-24 LAB — COMPREHENSIVE METABOLIC PANEL
ALT: 27 U/L (ref 17–63)
ANION GAP: 10 (ref 5–15)
AST: 30 U/L (ref 15–41)
Albumin: 3.7 g/dL (ref 3.5–5.0)
Alkaline Phosphatase: 89 U/L (ref 38–126)
BUN: 9 mg/dL (ref 6–20)
CHLORIDE: 101 mmol/L (ref 101–111)
CO2: 28 mmol/L (ref 22–32)
CREATININE: 0.41 mg/dL — AB (ref 0.61–1.24)
Calcium: 9.2 mg/dL (ref 8.9–10.3)
GFR calc non Af Amer: >60 mL/min (ref >60)
Glucose, Bld: 232 mg/dL — ABNORMAL HIGH (ref 65–99)
Potassium: 4.4 mmol/L (ref 3.5–5.1)
SODIUM: 139 mmol/L (ref 135–145)
Total Bilirubin: 0.6 mg/dL (ref 0.3–1.2)
Total Protein: 7.2 g/dL (ref 6.5–8.1)

## 2017-08-24 LAB — URINALYSIS, COMPLETE (UACMP) WITH MICROSCOPIC
BILIRUBIN URINE: NEGATIVE
Bacteria, UA: NONE SEEN
Glucose, UA: 50 mg/dL — AB
Ketones, ur: NEGATIVE mg/dL
NITRITE: NEGATIVE
PROTEIN: NEGATIVE mg/dL
Specific Gravity, Urine: 1.018 (ref 1.005–1.030)
pH: 7 (ref 5.0–8.0)

## 2017-08-24 LAB — CBC
HCT: 39 % — ABNORMAL LOW (ref 40.0–52.0)
Hemoglobin: 12.9 g/dL — ABNORMAL LOW (ref 13.0–18.0)
MCH: 31.1 pg (ref 26.0–34.0)
MCHC: 33.1 g/dL (ref 32.0–36.0)
MCV: 94 fL (ref 80.0–100.0)
PLATELETS: 158 10*3/uL (ref 150–440)
RBC: 4.15 MIL/uL — AB (ref 4.40–5.90)
RDW: 15.5 % — ABNORMAL HIGH (ref 11.5–14.5)
WBC: 9.6 10*3/uL (ref 3.8–10.6)

## 2017-08-24 LAB — LACTIC ACID, PLASMA
LACTIC ACID, VENOUS: 2.3 mmol/L — AB (ref 0.5–1.9)
Lactic Acid, Venous: 2.3 mmol/L (ref 0.5–1.9)

## 2017-08-24 LAB — TROPONIN I: Troponin I: <0.03 ng/mL (ref <0.03)

## 2017-08-24 MED ORDER — SODIUM CHLORIDE 0.9 % IV BOLUS (SEPSIS): 1000.0000 mL | Freq: Once | INTRAVENOUS | Status: DC

## 2017-08-24 MED ORDER — DEXTROSE-NACL 5-0.9 % IV SOLN
INTRAVENOUS | Status: AC
  Administered 2017-08-24: 17:00:00 via INTRAVENOUS

## 2017-08-24 MED ORDER — DEXTROSE 50 % IV SOLN
INTRAVENOUS | Status: AC
  Administered 2017-08-24: 50 mL via INTRAVENOUS
  Filled 2017-08-24: qty 50

## 2017-08-24 MED ORDER — DEXTROSE 50 % IV SOLN
1.0000 | Freq: Once | INTRAVENOUS | Status: AC
  Administered 2017-08-24: 50 mL via INTRAVENOUS

## 2017-08-24 NOTE — ED Triage Notes (Signed)
[facility] [place]. Per EMS pt was sent out due to not acting normal and weakness per family, per staff pt is at baseline. Pt smiles and nods appropriately but pt is non-verbal from a previous stroke. Pt is visualized in NAD at this time.

## 2017-08-24 NOTE — ED Notes (Signed)
Condom cath applied Connye BurkittAlly, Charity fundraiserN and this Charity fundraiserN.

## 2017-08-24 NOTE — ED Notes (Signed)
CT notified that patient can go to CT at this time.

## 2017-08-24 NOTE — ED Notes (Signed)
EMS arrived

## 2017-08-24 NOTE — ED Notes (Addendum)
UA collected by this RN. 

## 2017-08-24 NOTE — ED Provider Notes (Signed)
-----------------------------------------   3:10 PM on 08/24/2017 -----------------------------------------   Blood pressure 113/71, pulse (!) 58, temperature 97.6 F (36.4 C), temperature source Axillary, resp. rate 20, height 5\' 10"  (1.778 m), weight 45.4 kg (100 lb), SpO2 100 %.  Assuming care from Dr. Shaune PollackLord of Bill LentoWilliam M [name] is a 77 y.o. male with a chief complaint of Weakness .    Please refer to H&P by previous MD for further details.  The current plan of care is to f/u labs.   ED COURSE: Patient has been awake, interactive with his family, smiling, at his baseline according to the family. His workup has not revealed any acute abnormalities. His UA is dirty since we were unable to obtain a cath sample due to BPH. Culture is pending. On exam in the beginning of my shift, patient looked dry. He was found to be hypoglycemic and given 1 amp of D50. I ordered 1L D5NS for hydration and hypoglycemia. His initial lactate was slightly elevated which clinically is due to dehydration. Repeat lactate is unchanged however patient remains extremely well appearing with no signs of infection at this time. Tolerating PO. Patient's BG is stable . He has two decubitus ulcer which do not look infected. Head CT, CXR, CBC and CMP with no acute findings. At this time I believe patient is safe for dc home since he has been back to baseline for several hours. Family comfortable with this plan. Will dc home.        Nita SickleVeronese, [place], MD 08/24/17 [id number]

## 2017-08-24 NOTE — ED Notes (Signed)
Per family in room, pt to [facility] due to pt being bed bound and unable to ambulate.

## 2017-08-24 NOTE — ED Notes (Signed)
Attempted to collected UA via in and out without success.

## 2017-08-24 NOTE — ED Notes (Signed)
[facility], no answer at this time

## 2017-08-24 NOTE — ED Notes (Signed)
Pt changed, given peri care, pt resting at this time awaiting EMS transport

## 2017-08-24 NOTE — ED Notes (Signed)
Lab at bedside at this time to draw 2nd set of blood cultures.

## 2017-08-24 NOTE — ED Notes (Signed)
Nursing home called for update, let them know pt is waiting for EMS at this time to transport back [facility].

## 2017-08-24 NOTE — ED Provider Notes (Signed)
[facility] Emergency Department Provider Note ____________________________________________   I have reviewed the triage vital signs and the triage nursing note.  HISTORY  Chief Complaint Weakness   Historian Level 5 Caveat History Limited by patient with critical illness and poor historian, history of stroke, unclear baseline  HPI Bill [name] is a 77 y.o. male patient arrived from nursing home, apparently he is a full code, has a history of a stroke and completely dependent with ADLs per chart history, history of cardiac arrest in December, presents to the emergency department today with a history noted by nursing home report for decreased mental status, blood pressure 88 systolic.  Patient unable to provide any history.   Past Medical History:  Diagnosis Date  . Aphagia   . HTN (hypertension)   . Sigmoid volvulus (HCC)   . Stroke [facility]) 2002   completely dependent with ADLs/IADLs    Patient Active Problem List   Diagnosis Date Noted  . Pressure injury of skin 07/19/2017  . Syncope 07/18/2017  . Cardiac arrest (HCC) 02/01/2017  . HTN (hypertension) 02/01/2017  . 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 H/O: CVA (cerebrovascular accident) 04/13/2016  . Pulmonary nodule 04/13/2016  . Hypokalemia 04/13/2016    Past Surgical History:  Procedure Laterality Date  . ESOPHAGOGASTRODUODENOSCOPY N/A 07/21/2017   Procedure: ESOPHAGOGASTRODUODENOSCOPY (EGD);  Surgeon: Toledo, Boykin Nearing, MD;  Location: [facility] ENDOSCOPY;  Service: Gastroenterology;  Laterality: N/A;  . FLEXIBLE SIGMOIDOSCOPY Left 04/14/2016   Procedure: FLEXIBLE SIGMOIDOSCOPY;  Surgeon: Kathi Der, MD;  Location: [facility] ENDOSCOPY;  Service: Gastroenterology;  Laterality: Left;    Prior to Admission medications   Medication Sig Start Date End Date Taking? Authorizing Provider  acetaminophen (TYLENOL) 325 MG tablet Take 650 mg by mouth every 6 (six) hours as needed.   Yes [provider]  aspirin 81 MG chewable tablet Chew 81 mg by mouth daily. Crush tablet   Yes [provider]  atorvastatin (LIPITOR) 20 MG tablet Take 1 tablet (20 mg total) by mouth daily at 6 PM. 07/20/17  Yes Shaune Pollack, MD  bisacodyl (DULCOLAX) 5 MG EC tablet Take 1 tablet (5 mg total) by mouth daily as needed for moderate constipation. 07/24/17  Yes Wieting, Richard, MD  chlorhexidine (PERIDEX) 0.12 % solution 15 mLs by Mouth Rinse route 2 (two) times daily. 07/24/17  Yes Wieting, Richard, MD  magnesium oxide (MAG-OX) 400 (241.3 Mg) MG tablet Take 1 tablet (400 mg total) by mouth daily. 07/24/17  Yes Wieting, Richard, MD  ondansetron (ZOFRAN) 4 MG tablet Take 1 tablet (4 mg total) by mouth every 6 (six) hours as needed for nausea. 04/23/16  Yes Richarda Overlie, MD  potassium chloride (KLOR-CON) 20 MEQ packet Take 20 mEq by mouth daily. 07/24/17  Yes Wieting, Richard, MD  scopolamine (TRANSDERM-SCOP) 1 MG/3DAYS Place 1 patch onto the skin every 3 (three) days.   Yes [provider]  sennosides-docusate sodium (SENOKOT-S) 8.6-50 MG tablet Take 1 tablet by mouth 2 (two) times daily at 10 AM and 5 PM. 07/24/17  Yes Alford Highland, MD    No Known Allergies  Family History  Problem Relation Age of Onset  . Stroke Father   . Prostate cancer Father     Social History Social History   Tobacco Use  . Smoking status: Never Smoker  . Smokeless tobacco: Never Used  Substance Use Topics  . Alcohol use: No  . Drug use: No    Review of Systems  Unable to obtain  due to altered mental status.  ____________________________________________   PHYSICAL EXAM:  VITAL SIGNS: ED Triage Vitals  Enc Vitals Group     BP 08/24/17 1405 113/71     Pulse Rate 08/24/17 1405 (!) 58     Resp 08/24/17 1405 20     Temp 08/24/17 1405 97.6 F (36.4 C)     Temp Source 08/24/17 1405 Axillary     SpO2 08/24/17 1405 100 %     Weight 08/24/17 1406 100 lb (45.4 kg)     Height 08/24/17 1406 5\' 10"   (1.778 m)     Head Circumference --      Peak Flow --      Pain Score 08/24/17 1404 0     Pain Loc --      Pain Edu? --      Excl. in GC? --      Constitutional: Eyes open, but not really interactive or able to follow commands.  He is in no distress.  Cachectic. HEENT   Head: Normocephalic and atraumatic.      Eyes: Conjunctivae are normal. Pupils equal and round.       Ears:         Nose: No congestion/rhinnorhea.   Mouth/Throat: Mucous membranes are early dry.   Neck: No stridor. Cardiovascular/Chest: Normal rate, regular rhythm.  No murmurs, rubs, or gallops. Respiratory: Normal respiratory effort without tachypnea nor retractions. Breath sounds are clear and equal bilaterally.  Decreased air movement throughout.  No wheezes/rales/rhonchi. Gastrointestinal: Soft. No distention, no guarding, no rebound. Nontender.   Genitourinary/rectal:Deferred Musculoskeletal: Nontender with normal range of motion in all extremities. No joint effusions.  No lower extremity tenderness.  No edema. Neurologic:  Normal speech and language. No gross or focal neurologic deficits are appreciated. Skin:  Skin is warm, dry and intact. No rash noted. Psychiatric: No agitation.   ____________________________________________  LABS (pertinent positives/negatives) I, Governor Rooks, MD the attending physician have reviewed the labs noted below.  Labs Reviewed  CBC - Abnormal; Notable for the following components:      Result Value   RBC 4.15 (*)    Hemoglobin 12.9 (*)    HCT 39.0 (*)    RDW 15.5 (*)    All other components within normal limits  CULTURE, BLOOD (ROUTINE X 2)  CULTURE, BLOOD (ROUTINE X 2)  URINE CULTURE  TROPONIN I  LACTIC ACID, PLASMA  LACTIC ACID, PLASMA  COMPREHENSIVE METABOLIC PANEL  BLOOD GAS, VENOUS  URINALYSIS, COMPLETE (UACMP) WITH MICROSCOPIC  CBG MONITORING, ED    ____________________________________________    EKG I, Governor Rooks, MD, the attending  physician have personally viewed and interpreted all ECGs.  53 bpm.  Sinus bradycardia.  right bundle branch block.  Normal axis.  Nonspecific ST and T wave ____________________________________________  RADIOLOGY All Xrays were viewed by me.  Imaging interpreted by Radiologist, and I, Governor Rooks, MD the attending physician have reviewed the radiologist interpretation noted below.  Chest x-ray portable: Pending  CT head without contrast: Pending __________________________________________  PROCEDURES  Procedure(s) performed: None  Critical Care performed: CRITICAL CARE Performed by: Governor Rooks   Total critical care time: 30 minutes  Critical care time was exclusive of separately billable procedures and treating other patients.  Critical care was necessary to treat or prevent imminent or life-threatening deterioration.  Critical care was time spent personally by me on the following activities: development of treatment plan with patient and/or surrogate as well as nursing, discussions with consultants, evaluation of patient's  response to treatment, examination of patient, obtaining history from patient or surrogate, ordering and performing treatments and interventions, ordering and review of laboratory studies, ordering and review of radiographic studies, pulse oximetry and re-evaluation of patient's condition.    ____________________________________________  ED COURSE / ASSESSMENT AND PLAN  Pertinent labs & imaging results that were available during my care of the patient were reviewed by me and considered in my medical decision making (see chart for details).    Patient arrived with a complaint of altered mental status and hypotension although here he was not hypotensive.  Patient was found to have a blood sugar less than 30.  IV access was obtained by nurse at right external jugular and given D50.  Patient will also be started on IV fluids given that he looks  dehydrated.  No reported fevers, he is hypoxic.  However will evaluate for electrolyte disturbance, dehydration, sepsis, cardiac issues, etc.  Patient care transferred to Dr. Don PerkingVeronese at shift change 3 PM.  Disposition suspect admission, pending chest x-ray, Ct head, urinalysis, laboratory studies.  DIFFERENTIAL DIAGNOSIS: Differential diagnosis includes, but is not limited to, alcohol, illicit or prescription medications, or other toxic ingestion; intracranial pathology such as stroke or intracerebral hemorrhage; fever or infectious causes including sepsis; hypoxemia and/or hypercarbia; uremia; trauma; endocrine related disorders such as diabetes, hypoglycemia, and thyroid-related diseases; hypertensive encephalopathy; etc.      Patient / Family / Caregiver informed of clinical course, medical decision-making process, and agree with plan.     ___________________________________________   FINAL CLINICAL IMPRESSION(S) / ED DIAGNOSES   Final diagnoses:  Hypoglycemia  Altered mental status, unspecified altered mental status type      ___________________________________________        Note: This dictation was prepared with Dragon dictation. Any transcriptional errors that result from this process are unintentional    Governor RooksLord, Jaideep Pollack, MD 08/24/17 [phone]

## 2017-08-25 LAB — BLOOD GAS, VENOUS
ACID-BASE EXCESS: 5.3 mmol/L — AB (ref 0.0–2.0)
BICARBONATE: 33.2 mmol/L — AB (ref 20.0–28.0)
PCO2 VEN: 63 mmHg — AB (ref 44.0–60.0)
Patient temperature: 37
pH, Ven: 7.33 (ref 7.250–7.430)

## 2017-08-25 LAB — GLUCOSE, CAPILLARY: Glucose-Capillary: 15 mg/dL — CL (ref 65–99)

## 2017-08-26 LAB — URINE CULTURE: Culture: <10000 — AB

## 2017-08-29 LAB — CULTURE, BLOOD (ROUTINE X 2)
CULTURE: NO GROWTH
Culture: NO GROWTH
SPECIAL REQUESTS: ADEQUATE

## 2017-10-09 DEATH — deceased

## 2019-05-18 IMAGING — DX DG ABDOMEN 1V
2 series · 2 of 2 positions shown · non-contrast
Comparison: CT 07/21/2017, radiography 06/17/2017

CLINICAL DATA: Sigmoid volvulus

EXAM:
ABDOMEN - 1 VIEW

[abdomen kub (1 of 2)]
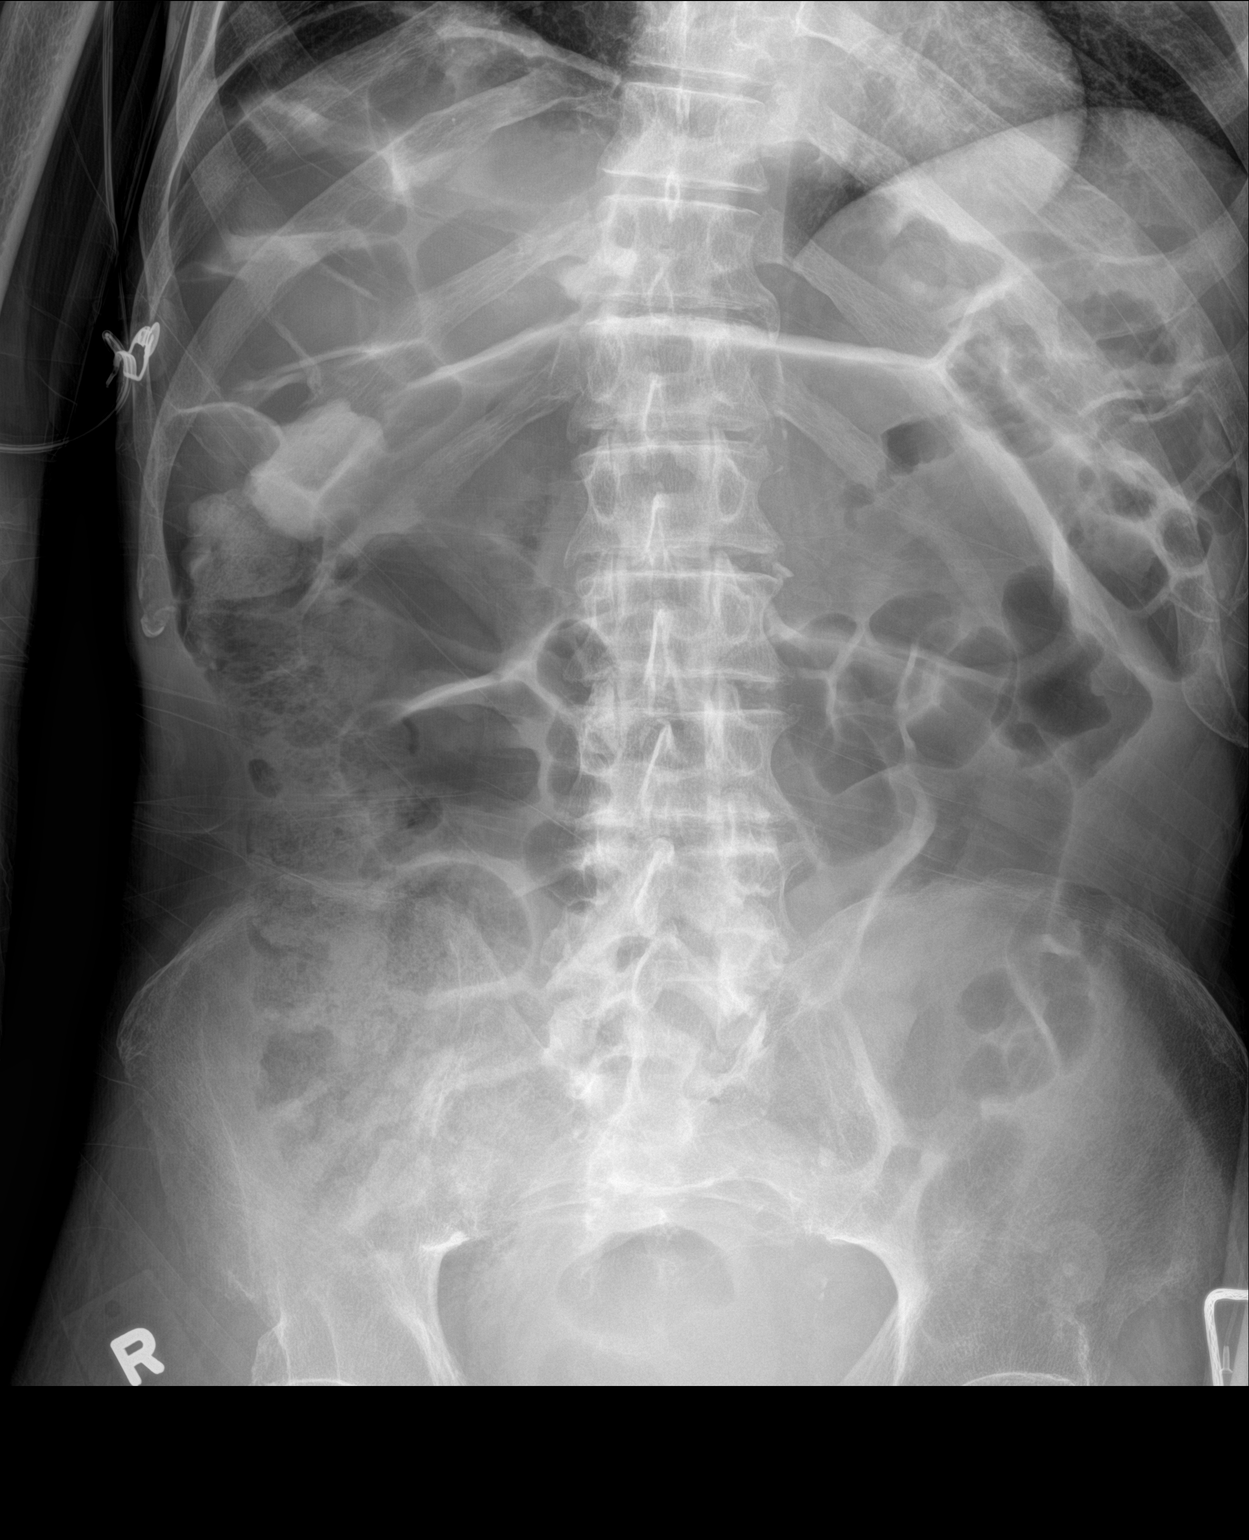

[abdomen kub (2 of 2)]
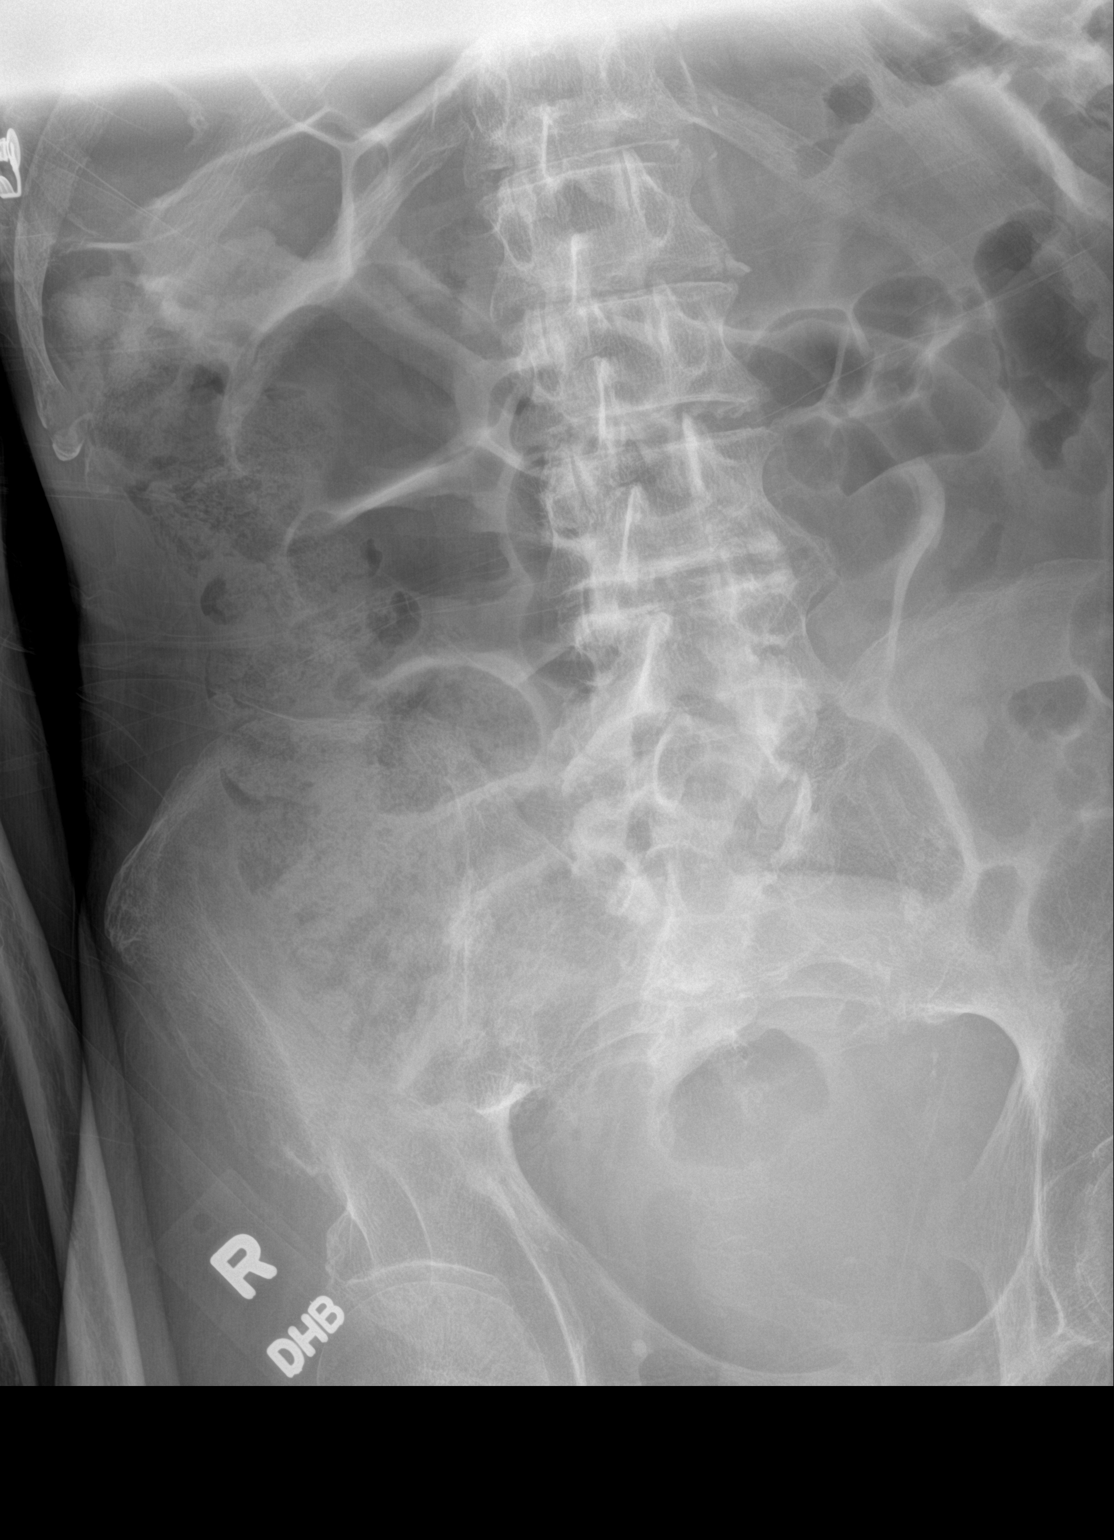

[2 of 2 positions shown; findings below may reference images not displayed]

FINDINGS: Moderate gaseous distention of the colon persists. No free air is
evident.
IMPRESSION: Persistent gaseous distention of the colon.  No free air.

## 2019-06-20 IMAGING — DX DG CHEST 1V PORT
1 series · 2 of 2 positions shown · non-contrast
Comparison: 07/21/2017 radiograph, 02/01/2017 radiograph

CLINICAL DATA: Altered mental status

EXAM:
PORTABLE CHEST 1 VIEW

[Series 1: chest ap · 0.14mm/px · 2 of 2 slices shown]
[im 1/2]
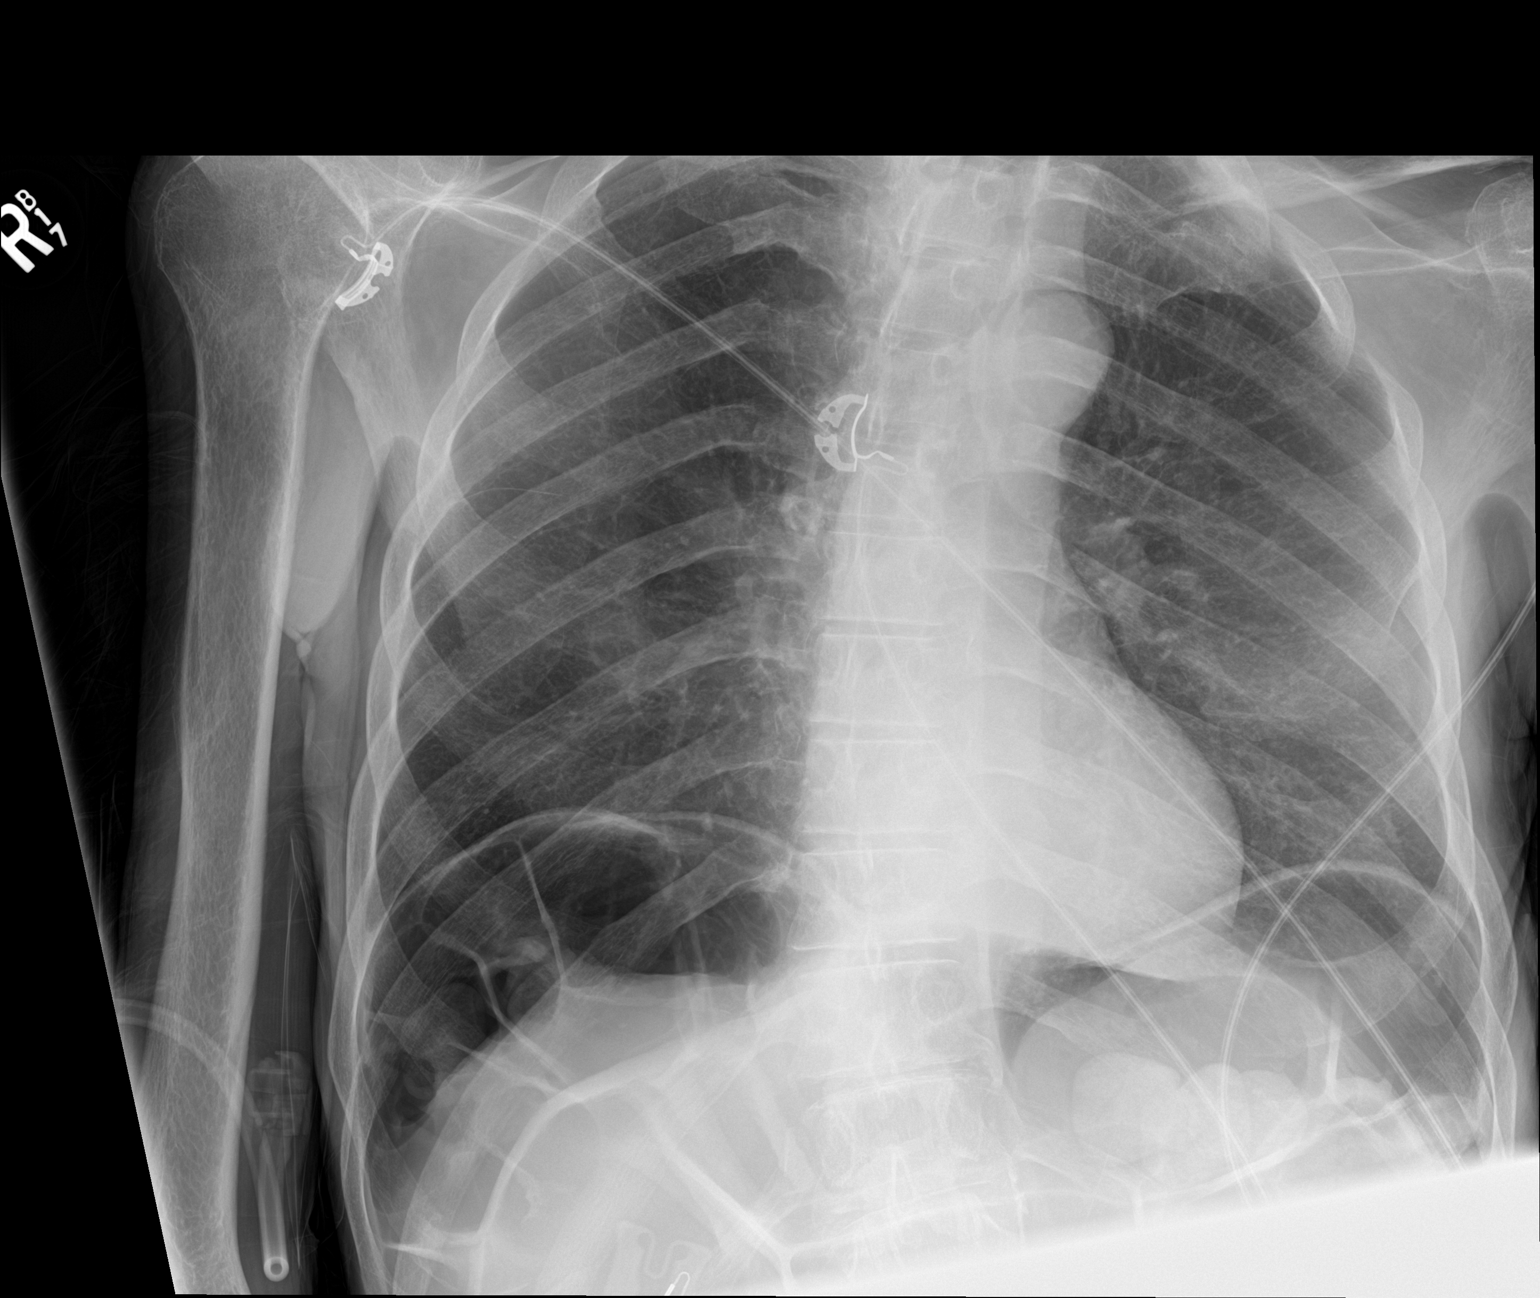
[im 2/2]
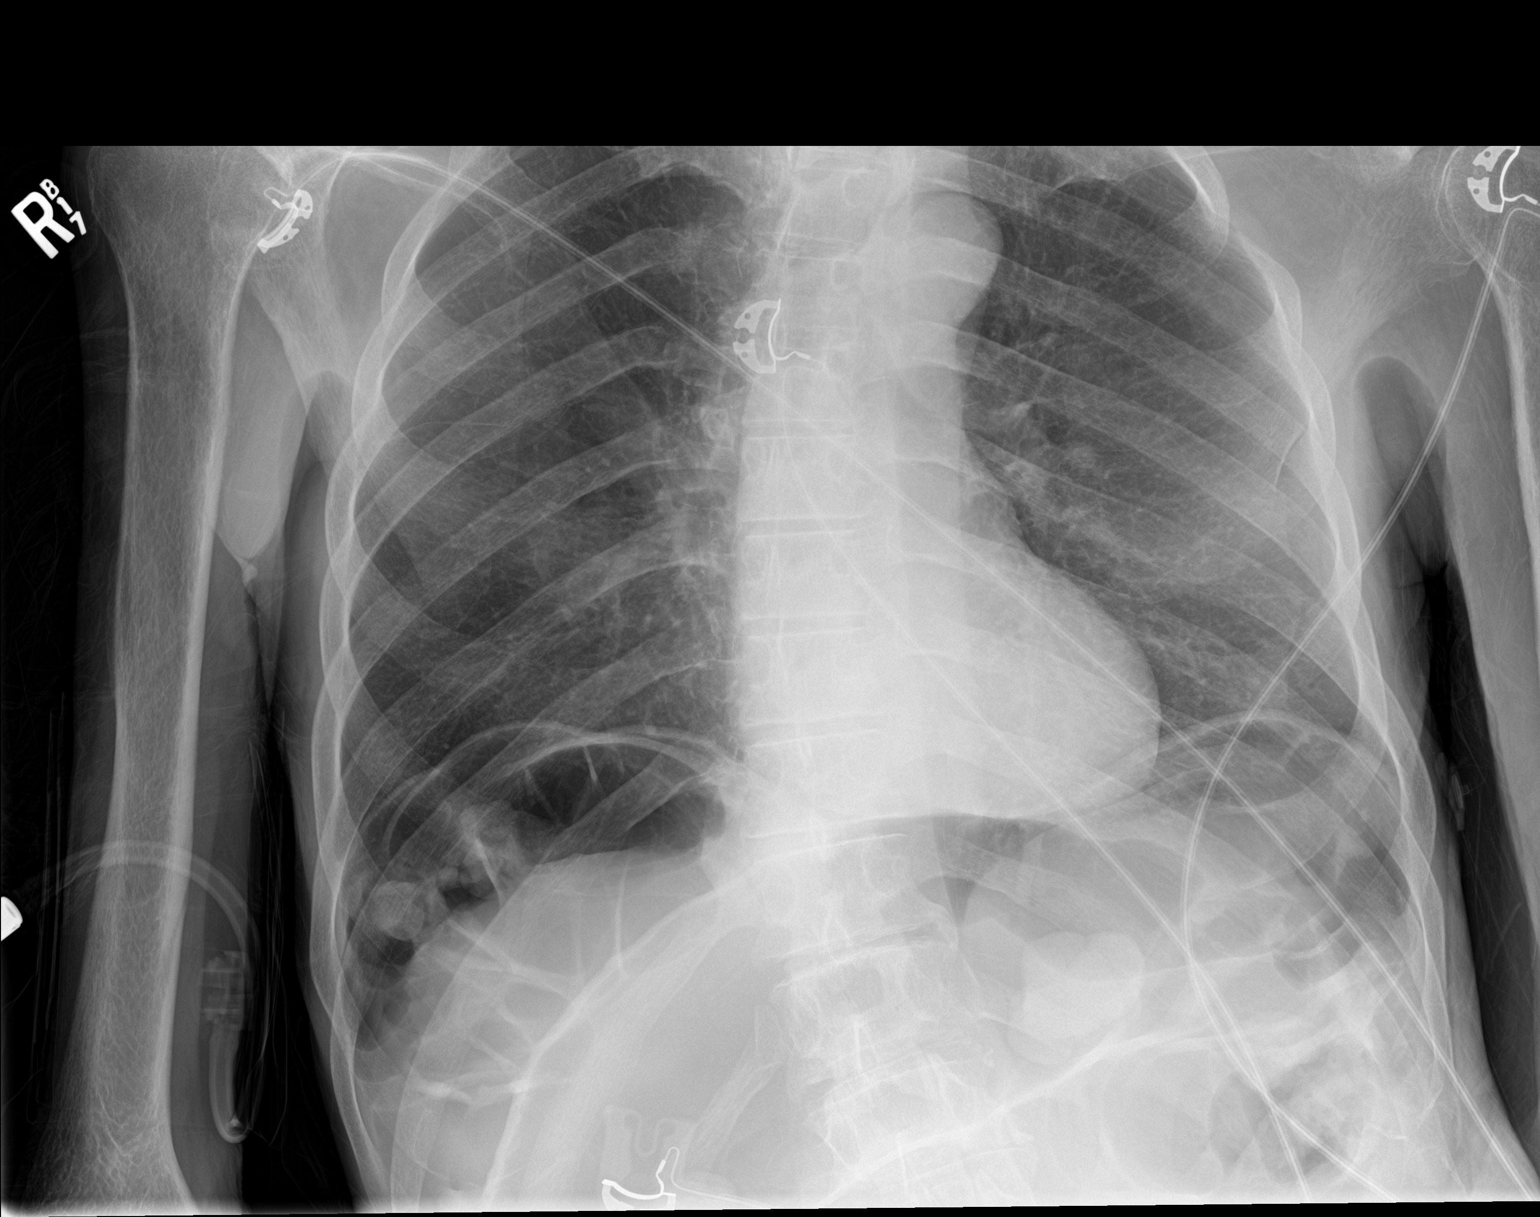

[2 of 2 positions shown; findings below may reference images not displayed]

FINDINGS: No acute pulmonary infiltrate or effusion. Normal cardiomediastinal
silhouette. No pneumothorax. Air-filled distended bowel beneath both
diaphragms, similar compared to previous radiographs.
IMPRESSION: No active disease. Similar appearance of gaseous enlargement of
bowel in the upper abdomen.
